# Patient Record
Sex: Female | Born: 1980 | Race: White | Hispanic: No | Marital: Married | Smoking: Current every day smoker
Health system: Southern US, Community
[De-identification: ages and names within clinical notes are randomized; demographics above are authoritative.]

## PROBLEM LIST (undated history)

## (undated) ENCOUNTER — Inpatient Hospital Stay (HOSPITAL_COMMUNITY): Payer: Self-pay

## (undated) DIAGNOSIS — K589 Irritable bowel syndrome without diarrhea: Secondary | ICD-10-CM

## (undated) DIAGNOSIS — Z8719 Personal history of other diseases of the digestive system: Secondary | ICD-10-CM

## (undated) DIAGNOSIS — Z5189 Encounter for other specified aftercare: Secondary | ICD-10-CM

## (undated) DIAGNOSIS — I499 Cardiac arrhythmia, unspecified: Secondary | ICD-10-CM

## (undated) DIAGNOSIS — F419 Anxiety disorder, unspecified: Secondary | ICD-10-CM

## (undated) DIAGNOSIS — Z9889 Other specified postprocedural states: Secondary | ICD-10-CM

## (undated) DIAGNOSIS — Z8711 Personal history of peptic ulcer disease: Secondary | ICD-10-CM

## (undated) DIAGNOSIS — R112 Nausea with vomiting, unspecified: Secondary | ICD-10-CM

## (undated) DIAGNOSIS — T753XXA Motion sickness, initial encounter: Secondary | ICD-10-CM

## (undated) DIAGNOSIS — G43909 Migraine, unspecified, not intractable, without status migrainosus: Secondary | ICD-10-CM

## (undated) DIAGNOSIS — R011 Cardiac murmur, unspecified: Secondary | ICD-10-CM

## (undated) HISTORY — DX: Irritable bowel syndrome, unspecified: K58.9

## (undated) HISTORY — PX: WISDOM TOOTH EXTRACTION: SHX21

## (undated) HISTORY — DX: Encounter for other specified aftercare: Z51.89

## (undated) HISTORY — DX: Anxiety disorder, unspecified: F41.9

## (undated) HISTORY — DX: Migraine, unspecified, not intractable, without status migrainosus: G43.909

## (undated) HISTORY — PX: LAPAROSCOPY: SHX197

## (undated) HISTORY — DX: Cardiac murmur, unspecified: R01.1

## (undated) HISTORY — PX: APPENDECTOMY: SHX54

## (undated) HISTORY — PX: LAPAROTOMY: SHX154

## (undated) HISTORY — DX: Personal history of other diseases of the digestive system: Z87.19

## (undated) HISTORY — DX: Personal history of peptic ulcer disease: Z87.11

## (undated) HISTORY — DX: Cardiac arrhythmia, unspecified: I49.9

---
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
## 2002-07-06 ENCOUNTER — Ambulatory Visit (HOSPITAL_COMMUNITY): Admission: RE | Admit: 2002-07-06 | Discharge: 2002-07-06 | Payer: Self-pay | Admitting: Internal Medicine

## 2002-07-14 ENCOUNTER — Encounter: Payer: Self-pay | Admitting: Internal Medicine

## 2002-07-14 ENCOUNTER — Encounter: Admission: RE | Admit: 2002-07-14 | Discharge: 2002-07-14 | Payer: Self-pay | Admitting: Internal Medicine

## 2002-08-19 ENCOUNTER — Other Ambulatory Visit: Admission: RE | Admit: 2002-08-19 | Discharge: 2002-08-19 | Payer: Self-pay | Admitting: Gynecology

## 2003-04-14 ENCOUNTER — Encounter (INDEPENDENT_AMBULATORY_CARE_PROVIDER_SITE_OTHER): Payer: Self-pay | Admitting: Plastic Surgery

## 2003-04-14 ENCOUNTER — Ambulatory Visit (HOSPITAL_BASED_OUTPATIENT_CLINIC_OR_DEPARTMENT_OTHER): Admission: RE | Admit: 2003-04-14 | Discharge: 2003-04-14 | Payer: Self-pay | Admitting: Plastic Surgery

## 2003-04-14 ENCOUNTER — Ambulatory Visit (HOSPITAL_COMMUNITY): Admission: RE | Admit: 2003-04-14 | Discharge: 2003-04-14 | Payer: Self-pay | Admitting: Plastic Surgery

## 2004-01-02 ENCOUNTER — Other Ambulatory Visit: Admission: RE | Admit: 2004-01-02 | Discharge: 2004-01-02 | Payer: Self-pay | Admitting: Gynecology

## 2004-10-15 ENCOUNTER — Other Ambulatory Visit: Admission: RE | Admit: 2004-10-15 | Discharge: 2004-10-15 | Payer: Self-pay | Admitting: Obstetrics and Gynecology

## 2005-07-11 ENCOUNTER — Other Ambulatory Visit: Admission: RE | Admit: 2005-07-11 | Discharge: 2005-07-11 | Payer: Self-pay | Admitting: Obstetrics and Gynecology

## 2005-07-30 ENCOUNTER — Encounter: Admission: RE | Admit: 2005-07-30 | Discharge: 2005-07-30 | Payer: Self-pay | Admitting: Family Medicine

## 2006-07-14 ENCOUNTER — Other Ambulatory Visit: Admission: RE | Admit: 2006-07-14 | Discharge: 2006-07-14 | Payer: Self-pay | Admitting: Obstetrics and Gynecology

## 2006-10-20 ENCOUNTER — Encounter: Admission: RE | Admit: 2006-10-20 | Discharge: 2006-10-20 | Payer: Self-pay | Admitting: Family Medicine

## 2007-10-01 ENCOUNTER — Other Ambulatory Visit: Admission: RE | Admit: 2007-10-01 | Discharge: 2007-10-01 | Payer: Self-pay | Admitting: Obstetrics and Gynecology

## 2008-06-10 ENCOUNTER — Ambulatory Visit: Payer: Self-pay | Admitting: Internal Medicine

## 2009-07-06 ENCOUNTER — Other Ambulatory Visit: Payer: Self-pay | Admitting: Internal Medicine

## 2009-07-08 HISTORY — PX: CHOLECYSTECTOMY: SHX55

## 2009-07-24 ENCOUNTER — Other Ambulatory Visit: Payer: Self-pay | Admitting: Internal Medicine

## 2009-11-09 ENCOUNTER — Ambulatory Visit: Payer: Self-pay | Admitting: Otolaryngology

## 2010-03-16 ENCOUNTER — Ambulatory Visit: Payer: Self-pay | Admitting: Internal Medicine

## 2010-04-27 ENCOUNTER — Ambulatory Visit: Payer: Self-pay | Admitting: Emergency Medicine

## 2010-05-11 ENCOUNTER — Ambulatory Visit: Payer: Self-pay | Admitting: Surgery

## 2010-06-06 ENCOUNTER — Other Ambulatory Visit: Payer: Self-pay | Admitting: Surgery

## 2010-06-11 ENCOUNTER — Ambulatory Visit: Payer: Self-pay | Admitting: Surgery

## 2010-06-12 LAB — PATHOLOGY REPORT

## 2010-07-08 HISTORY — PX: RHINOPLASTY: SUR1284

## 2011-09-05 ENCOUNTER — Encounter: Payer: Self-pay | Admitting: Maternal & Fetal Medicine

## 2011-09-06 ENCOUNTER — Ambulatory Visit: Payer: Self-pay | Admitting: Maternal & Fetal Medicine

## 2011-09-06 DIAGNOSIS — I4949 Other premature depolarization: Secondary | ICD-10-CM

## 2011-09-11 ENCOUNTER — Ambulatory Visit: Payer: Self-pay | Admitting: Maternal & Fetal Medicine

## 2011-09-11 IMAGING — NM NUCLEAR MEDICINE HEPATOHBILIARY INCLUDE GB
3 series · 21 of 21 positions shown · non-contrast
Comparison: none

REASON FOR EXAM: Cholecystitis       US Done April 27, 2010 Negative
COMMENTS:
TECHNIQUE: Following the uneventful intravenous infusion of
radiopharmaceutical, dynamic anterior regional imaging was obtained over the
liver for 60 minutes.

[Series 1000: gallbladder statics · 4.80mm/px · 9 of 9 slices shown]
[im 1/9]
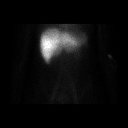
[im 2/9]
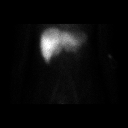
[im 3/9]
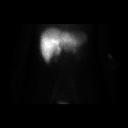
[im 4/9]
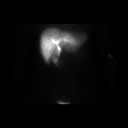
[im 5/9]
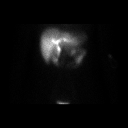
[im 6/9]
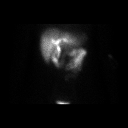
[im 7/9]
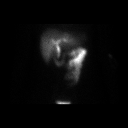
[im 8/9]
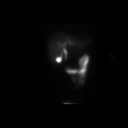
[im 9/9]
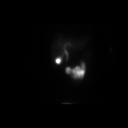

[Series 1000: gallbladder dynamic (results) · 4.80mm/px · 6 of 60 frames shown]
[frame 6/60]
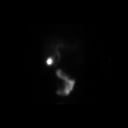
[frame 16/60]
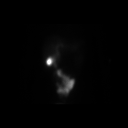
[frame 26/60]
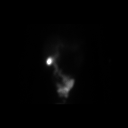
[frame 36/60]
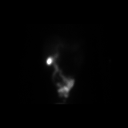
[frame 46/60]
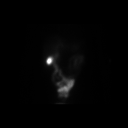
[frame 56/60]
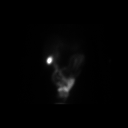

[Series 1000: gallbladder dynamic · 4.80mm/px · 6 of 60 frames shown]
[frame 6/60]
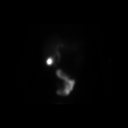
[frame 16/60]
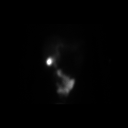
[frame 26/60]
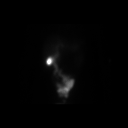
[frame 36/60]
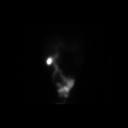
[frame 46/60]
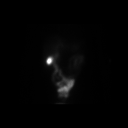
[frame 56/60]
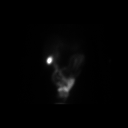

[21 of 21 positions shown; findings below may reference images not displayed]

PROCEDURE:     NM  - NM HEPATO WITH GB EJECT FRACTION  - May 11, 2010  [DATE]

RESULT:     History: Cholecystitis

Radiopharmaceutical: 8.35 mCi [id number] labeled Mebrofenin was administered
intravenously. Once the gallbladder had accumulated tracer, the patient was
given CCK intravenously per standard protocol.
FINDINGS: There is immediate homogeneous uptake of radiotracer in the liver.
Filling of the gallbladder begins at 50 minutes. Radiotracer uptake is
present in the small bowel at 20 minutes.

When gallbladder filling was complete, the patient was given an infusion of
1.25 mcg CCK over 30 minutes. At 30 minutes, the total ejection fraction was
19%. The normal range of ejection fraction is 35%.
IMPRESSION: 1. Normal hepatobiliary scan.
2. Abnormal gallbladder ejection fraction.

## 2012-02-16 ENCOUNTER — Inpatient Hospital Stay: Payer: Self-pay

## 2012-02-16 LAB — CBC WITH DIFFERENTIAL/PLATELET
Basophil #: 0 10*3/uL (ref 0.0–0.1)
Basophil %: 0.5 %
Eosinophil #: 0.1 10*3/uL (ref 0.0–0.7)
Eosinophil %: 0.9 %
HCT: 39 % (ref 35.0–47.0)
HGB: 13.2 g/dL (ref 12.0–16.0)
Lymphocyte #: 2.5 10*3/uL (ref 1.0–3.6)
Lymphocyte %: 26.2 %
MCH: 31.4 pg (ref 26.0–34.0)
MCHC: 33.9 g/dL (ref 32.0–36.0)
MCV: 93 fL (ref 80–100)
Monocyte #: 0.7 x10 3/mm (ref 0.2–0.9)
Monocyte %: 6.9 %
Neutrophil #: 6.3 10*3/uL (ref 1.4–6.5)
Neutrophil %: 65.5 %
Platelet: 230 10*3/uL (ref 150–440)
RBC: 4.21 10*6/uL (ref 3.80–5.20)
RDW: 12.6 % (ref 11.5–14.5)
WBC: 9.6 10*3/uL (ref 3.6–11.0)

## 2012-02-17 LAB — HEMATOCRIT: HCT: 34 % — ABNORMAL LOW (ref 35.0–47.0)

## 2012-09-09 ENCOUNTER — Ambulatory Visit (INDEPENDENT_AMBULATORY_CARE_PROVIDER_SITE_OTHER): Payer: 59 | Admitting: Adult Health

## 2012-09-09 ENCOUNTER — Encounter: Payer: Self-pay | Admitting: Adult Health

## 2012-09-09 VITALS — BP 115/74 | HR 99 | Temp 99.0°F | Resp 14 | Ht 65.0 in | Wt 135.0 lb

## 2012-09-09 DIAGNOSIS — Z0000 Encounter for general adult medical examination without abnormal findings: Secondary | ICD-10-CM

## 2012-09-09 DIAGNOSIS — G43909 Migraine, unspecified, not intractable, without status migrainosus: Secondary | ICD-10-CM | POA: Insufficient documentation

## 2012-09-09 MED ORDER — BUTALBITAL-APAP-CAFFEINE 50-325-40 MG PO TABS: 1.0000 | ORAL_TABLET | Freq: Two times a day (BID) | ORAL | Status: DC | PRN

## 2012-09-09 NOTE — Patient Instructions (Addendum)
  Thank you for choosing [facility] for your health care needs.  Please have your fasting labs drawn at your earliest convenience. Once I receive the results, I will let you know.  Remember to activate your MyChart for easy access to your medical records, labs, etc.
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
## 2012-09-10 ENCOUNTER — Encounter: Payer: Self-pay | Admitting: Adult Health

## 2012-09-10 NOTE — Assessment & Plan Note (Signed)
She has tried numerous migraine medications without real relief. She has also seen a neurologist and is s/p MRI which was negative per patient report. The only medication that has helped her in the past and doesn't make her feel like she is tired is Fioricet. Will order this for her.

## 2012-09-10 NOTE — Assessment & Plan Note (Signed)
Normal physical exam except for tenderness upon palpating abdomen RLQ, RUQ. Patient reports that she has had this evaluated by her OB-GYN since this has occurred since she delivered her child 7 months ago. Check routine labs: cbc w/diff, lipids. I am also ordering a liver panel. She may need a CT scan of the abdomen given persistence of this tenderness.

## 2012-09-24 ENCOUNTER — Other Ambulatory Visit: Payer: Self-pay | Admitting: Internal Medicine

## 2012-09-24 ENCOUNTER — Telehealth: Payer: Self-pay | Admitting: Adult Health

## 2012-09-24 LAB — CBC WITH DIFFERENTIAL/PLATELET
Basophil #: 0.1 10*3/uL (ref 0.0–0.1)
Basophil %: 0.7 %
Eosinophil %: 0.7 %
HCT: 44.1 % (ref 35.0–47.0)
HGB: 15 g/dL (ref 12.0–16.0)
Lymphocyte #: 2.2 10*3/uL (ref 1.0–3.6)
Monocyte #: 0.5 x10 3/mm (ref 0.2–0.9)
Monocyte %: 6.2 %
Neutrophil #: 5.8 10*3/uL (ref 1.4–6.5)
Neutrophil %: 67.2 %
Platelet: 270 10*3/uL (ref 150–440)
RBC: 4.9 10*6/uL (ref 3.80–5.20)
WBC: 8.6 10*3/uL (ref 3.6–11.0)

## 2012-09-24 LAB — LIPID PANEL: Triglycerides: 109 mg/dL (ref 0–200)

## 2012-09-24 LAB — COMPREHENSIVE METABOLIC PANEL
Albumin: 3.9 g/dL (ref 3.4–5.0)
Alkaline Phosphatase: 80 U/L (ref 50–136)
Anion Gap: 6 — ABNORMAL LOW (ref 7–16)
BUN: 8 mg/dL (ref 7–18)
Bilirubin,Total: 0.4 mg/dL (ref 0.2–1.0)
Calcium, Total: 8.8 mg/dL (ref 8.5–10.1)
Creatinine: 0.76 mg/dL (ref 0.60–1.30)
EGFR (African American): >60
EGFR (Non-African Amer.): >60
Glucose: 84 mg/dL (ref 65–99)
Osmolality: 273 (ref 275–301)
SGOT(AST): 12 U/L — ABNORMAL LOW (ref 15–37)
SGPT (ALT): 18 U/L (ref 12–78)
Sodium: 138 mmol/L (ref 136–145)
Total Protein: 7.7 g/dL (ref 6.4–8.2)

## 2012-09-24 LAB — BILIRUBIN, DIRECT: Bilirubin, Direct: <0.1 mg/dL (ref 0.00–0.20)

## 2012-09-24 NOTE — Telephone Encounter (Signed)
[facility] labs in box °

## 2012-10-02 ENCOUNTER — Telehealth: Payer: Self-pay | Admitting: Adult Health

## 2012-10-02 NOTE — Telephone Encounter (Signed)
Lab work done last week at hospital, wants to make sure we received.

## 2012-10-05 NOTE — Telephone Encounter (Signed)
Patient's labs may have been sent to Dr. Dan Humphreys. Can someone check and give the patient a call back with her results.

## 2012-10-06 NOTE — Telephone Encounter (Signed)
Requested labs from [facility]

## 2012-10-07 ENCOUNTER — Telehealth: Payer: Self-pay | Admitting: *Deleted

## 2012-10-07 NOTE — Telephone Encounter (Signed)
Informed patient of normal lab results via cell phone

## 2012-10-07 NOTE — Telephone Encounter (Signed)
Labs received and patient informed.  

## 2012-10-12 ENCOUNTER — Encounter: Payer: Self-pay | Admitting: Internal Medicine

## 2012-10-15 ENCOUNTER — Encounter: Payer: Self-pay | Admitting: Internal Medicine

## 2012-11-24 ENCOUNTER — Telehealth: Payer: Self-pay | Admitting: *Deleted

## 2012-11-24 NOTE — Telephone Encounter (Signed)
Left message for pt to return my call.

## 2012-11-24 NOTE — Telephone Encounter (Signed)
Spoke with pt, thinks right lower quadrant pain has worsened slightly since office visit in March, "more aware" of it now. Pain comes and goes, Ibuprofen, Tylenol does not relieve pain. Denies any other symptoms, no fever, constipation, no bowel changes, no urinary symptoms. Pt unable to relate it to eating, or not eating, moving, lying down. Not related to any specific activity. Does she need to be seen again? Last note mentions possibly needing CT.

## 2012-11-24 NOTE — Telephone Encounter (Signed)
Patient called and stated she is still having he abd pain, she was told to call back if still having symptoms.

## 2012-11-25 NOTE — Telephone Encounter (Signed)
Pt notified of need for an appointment, left message advising to call back and schedule an appointment with Raquel for further evaluation.

## 2012-11-25 NOTE — Telephone Encounter (Signed)
Needs appointment

## 2012-12-03 ENCOUNTER — Encounter: Payer: Self-pay | Admitting: Adult Health

## 2012-12-03 ENCOUNTER — Ambulatory Visit (INDEPENDENT_AMBULATORY_CARE_PROVIDER_SITE_OTHER): Payer: 59 | Admitting: Adult Health

## 2012-12-03 VITALS — BP 116/64 | HR 74 | Resp 12 | Wt 132.5 lb

## 2012-12-03 DIAGNOSIS — G43909 Migraine, unspecified, not intractable, without status migrainosus: Secondary | ICD-10-CM

## 2012-12-03 DIAGNOSIS — R109 Unspecified abdominal pain: Secondary | ICD-10-CM

## 2012-12-03 MED ORDER — TOPIRAMATE 25 MG PO TABS: ORAL_TABLET | ORAL | Status: DC

## 2012-12-03 NOTE — Assessment & Plan Note (Signed)
Start topamax 25 mg daily x 1 week then increase to 50 mg daily. RTC in 4 weeks for f/u.

## 2012-12-03 NOTE — Progress Notes (Signed)
  Subjective:    Patient ID: Ashley Hancock, female    DOB: 01-31-1981, 32 y.o.   MRN: [id number]  HPI  Patient presents with continued lower abdominal pain. Achy, crampy. Sometimes there is a sharp pain. Reports dull ache most of the time. RLQ. She does not notice a pattern to the pain. It is not necessarily worse after eating or exercising. The pain just comes and goes. Pain does not wake her up. Occasional nausea. She notices the pain "sometime" radiates across from left to right and sometimes around to her back. She is s/p cholecystectomy (06/2010). Patient does not have menstrual cycle since being on Depo shots. She has a hx of IBS and after her cholecystectomy her BMs are more frequent. She has not noticed a change in her bowels patterns.  Also having frequent Migraines. Lasting approximately 2-3 days. Fioricet helps but she is interested in preventative measures.   Review of Systems  Constitutional: Negative for fever and chills.  Gastrointestinal: Positive for nausea and abdominal pain. Negative for vomiting, diarrhea, constipation and blood in stool.  Genitourinary: Positive for dyspareunia. Negative for dysuria, frequency, hematuria, vaginal discharge, genital sores and menstrual problem.  Musculoskeletal: Positive for back pain.   BP 116/64  Pulse 74  Resp 12  Wt 132 lb 8 oz (60.102 kg)  BMI 22.05 kg/m2  SpO2 97%  LMP 04/07/2012    Objective:   Physical Exam  Constitutional: She is oriented to person, place, and time. She appears well-developed and well-nourished. No distress.  HENT:  Head: Normocephalic and atraumatic.  Cardiovascular: Normal rate and regular rhythm.   Pulmonary/Chest: Effort normal. No respiratory distress.  Abdominal: Soft. She exhibits no distension and no mass. There is tenderness.  Hyperactive bowel sounds  Neurological: She is alert and oriented to person, place, and time.  Skin: Skin is warm and dry.  Psychiatric: She has a normal mood and affect.  Her behavior is normal. Judgment and thought content normal.       Assessment & Plan:

## 2012-12-03 NOTE — Assessment & Plan Note (Signed)
Pelvic ultrasound and total abdominal ultrasound. Recommend prilosec or other OTC PPI for her epigastric tenderness.

## 2012-12-03 NOTE — Patient Instructions (Addendum)
  I am ordering an Ultrasound of Abdomen and pelvis for abdominal pain.  I am also starting you on topamax for your migraine HA prevention.  Take 25 mg at bedtime for 1 week then increase to 50 mg daily.  Follow up in 4 weeks.

## 2012-12-07 ENCOUNTER — Telehealth: Payer: Self-pay | Admitting: Adult Health

## 2012-12-07 NOTE — Telephone Encounter (Signed)
Left message, advising pt of results.

## 2012-12-07 NOTE — Telephone Encounter (Signed)
Patient called wanting her results from her Abdominal Ultrasound.

## 2012-12-07 NOTE — Telephone Encounter (Signed)
Normal

## 2012-12-07 NOTE — Telephone Encounter (Signed)
I thought I remember seeing this Friday, do you have the report?

## 2012-12-31 ENCOUNTER — Ambulatory Visit: Payer: 59 | Admitting: Adult Health

## 2013-01-06 ENCOUNTER — Encounter: Payer: Self-pay | Admitting: Adult Health

## 2013-01-06 ENCOUNTER — Ambulatory Visit (INDEPENDENT_AMBULATORY_CARE_PROVIDER_SITE_OTHER): Payer: 59 | Admitting: Adult Health

## 2013-01-06 VITALS — BP 106/66 | HR 88 | Temp 98.4°F | Resp 12 | Wt 130.0 lb

## 2013-01-06 DIAGNOSIS — G43909 Migraine, unspecified, not intractable, without status migrainosus: Secondary | ICD-10-CM

## 2013-01-06 MED ORDER — SUMATRIPTAN SUCCINATE 100 MG PO TABS: 100.0000 mg | ORAL_TABLET | Freq: Once | ORAL | Status: DC

## 2013-01-06 MED ORDER — LEVONORGEST-ETH ESTRAD 91-DAY 0.15-0.03 &0.01 MG PO TABS: 1.0000 | ORAL_TABLET | Freq: Every day | ORAL | Status: DC

## 2013-01-06 NOTE — Assessment & Plan Note (Addendum)
Patient was started on Topamax 25 mg daily x1 week and then increase to 50 mg. She reports feeling a bit "off" on the 50 mg dose. Recommend trying to 25 mg for longer period (2-3 weeks) and gradually increasing to see if better tolerated. Patient's migraines appear to also be hormone related as her migraines increase right before her period. She is currently on Yaz 28 day cycle BC. Discussed switching to [facility] (continues 12 weeks) with an effort of trying to keep her hormones at a steadier state. She is agreeable to try this. Prescription given. Also sent in refills for imitrex.

## 2013-01-06 NOTE — Progress Notes (Signed)
  Subjective:    Patient ID: Ashley Hancock, female    DOB: 1981-02-26, 32 y.o.   MRN: [id number]  HPI  Patient is a pleasant 32 year old female who presents to clinic for followup of migraines. She was started on Topamax during her previous visit. She initially started with 25 mg and a week later went up to 50 mg. She has noticed that on the 50 mg she feels a little bit "off". Describes is not quite as lightheaded but just the "feeling you get when you take a muscle relaxer". She has had 3 migraine headaches within the last week week. She reports that this was the week right before her menstrual cycle. She is currently taking Yaz birth control pills 28 day cycle. Patient is currently on her menstrual cycle.   Current Outpatient Prescriptions on File Prior to Visit  Medication Sig Dispense Refill  . butalbital-acetaminophen-caffeine (FIORICET, ESGIC) 50-325-40 MG per tablet Take 1 tablet by mouth 2 (two) times daily as needed for headache.  30 tablet  3  . cyclobenzaprine (FLEXERIL) 5 MG tablet Take 5 mg by mouth as needed for muscle spasms.       No current facility-administered medications on file prior to visit.     Review of Systems  Constitutional: Negative.   Eyes: Negative.   Respiratory: Negative.   Cardiovascular: Negative.   Gastrointestinal: Negative.   Genitourinary: Negative.   Musculoskeletal: Negative.   Neurological: Positive for headaches. Negative for dizziness, weakness and light-headedness.  Psychiatric/Behavioral: Negative.     BP 106/66  Pulse 88  Temp(Src) 98.4 F (36.9 C) (Oral)  Resp 12  Wt 130 lb (58.968 kg)  BMI 21.63 kg/m2  SpO2 96%  LMP 12/30/2012    Objective:   Physical Exam  Constitutional: She is oriented to person, place, and time. She appears well-developed and well-nourished. No distress.  Cardiovascular: Normal rate, regular rhythm and normal heart sounds.  Exam reveals no gallop and no friction rub.   No murmur heard. Pulmonary/Chest:  Effort normal and breath sounds normal. No respiratory distress. She has no wheezes. She has no rales.  Abdominal: Soft. Bowel sounds are normal.  Musculoskeletal: Normal range of motion.  Neurological: She is alert and oriented to person, place, and time. No cranial nerve deficit. Coordination normal.  Skin: Skin is warm and dry.  Psychiatric: She has a normal mood and affect. Her behavior is normal. Judgment and thought content normal.      Assessment & Plan:

## 2013-01-21 ENCOUNTER — Encounter: Payer: Self-pay | Admitting: Adult Health

## 2013-04-08 ENCOUNTER — Other Ambulatory Visit: Payer: Self-pay | Admitting: *Deleted

## 2013-04-08 NOTE — Telephone Encounter (Signed)
Ok to refill 

## 2013-04-11 MED ORDER — BUTALBITAL-APAP-CAFFEINE 50-325-40 MG PO TABS: 1.0000 | ORAL_TABLET | Freq: Two times a day (BID) | ORAL | Status: DC | PRN

## 2013-04-11 NOTE — Telephone Encounter (Signed)
Yes.  OK to refill. 

## 2013-04-12 NOTE — Telephone Encounter (Signed)
Rx faxed to pharmacy  

## 2013-04-28 ENCOUNTER — Other Ambulatory Visit: Payer: Self-pay

## 2013-04-28 LAB — TSH: Thyroid Stimulating Horm: 0.696 u[IU]/mL

## 2013-04-28 LAB — LIPID PANEL
Triglycerides: 137 mg/dL (ref 0–200)
VLDL Cholesterol, Calc: 27 mg/dL (ref 5–40)

## 2013-05-04 ENCOUNTER — Telehealth: Payer: Self-pay | Admitting: Adult Health

## 2013-05-04 NOTE — Telephone Encounter (Signed)
Left message for pt to return my call.

## 2013-05-04 NOTE — Telephone Encounter (Signed)
Pt states she has used Maxalt in the past. States Imitrex was sent in at last visit but prefers Maxalt 10 mg due to side effects from Imitrex. Ok?

## 2013-05-04 NOTE — Telephone Encounter (Signed)
The patient is needing Maxalt called into [facility] pharmacy. Please call the patient when the prescription has been called into the pharmacy.

## 2013-05-04 NOTE — Telephone Encounter (Signed)
Maxalt is ok

## 2013-05-05 MED ORDER — RIZATRIPTAN BENZOATE 10 MG PO TABS: 10.0000 mg | ORAL_TABLET | ORAL | Status: DC | PRN

## 2013-05-05 NOTE — Telephone Encounter (Signed)
Rx sent to pharmacy by escript  

## 2013-05-21 ENCOUNTER — Emergency Department: Payer: Self-pay | Admitting: Emergency Medicine

## 2013-05-21 LAB — URINALYSIS, COMPLETE
Bilirubin,UR: NEGATIVE
Glucose,UR: NEGATIVE mg/dL (ref 0–75)
Ketone: NEGATIVE
Leukocyte Esterase: NEGATIVE
Ph: 7 (ref 4.5–8.0)
Specific Gravity: 1.005 (ref 1.003–1.030)
Squamous Epithelial: 3
WBC UR: <1 /HPF (ref 0–5)

## 2013-05-21 LAB — COMPREHENSIVE METABOLIC PANEL
Anion Gap: 4 — ABNORMAL LOW (ref 7–16)
BUN: 8 mg/dL (ref 7–18)
Bilirubin,Total: 0.2 mg/dL (ref 0.2–1.0)
Calcium, Total: 9.4 mg/dL (ref 8.5–10.1)
Co2: 25 mmol/L (ref 21–32)
Creatinine: 0.74 mg/dL (ref 0.60–1.30)
EGFR (African American): >60
EGFR (Non-African Amer.): >60
Potassium: 3.8 mmol/L (ref 3.5–5.1)
SGOT(AST): 20 U/L (ref 15–37)
SGPT (ALT): 16 U/L (ref 12–78)
Sodium: 138 mmol/L (ref 136–145)
Total Protein: 7.7 g/dL (ref 6.4–8.2)

## 2013-05-21 LAB — LIPASE, BLOOD: Lipase: 77 U/L (ref 73–393)

## 2013-05-21 LAB — CBC
HCT: 44.8 % (ref 35.0–47.0)
MCH: 31.2 pg (ref 26.0–34.0)
WBC: 7.1 10*3/uL (ref 3.6–11.0)

## 2013-05-21 LAB — PREGNANCY, URINE: Pregnancy Test, Urine: NEGATIVE m[IU]/mL

## 2013-05-22 ENCOUNTER — Emergency Department: Payer: Self-pay | Admitting: Emergency Medicine

## 2013-05-22 LAB — URINALYSIS, COMPLETE
Bilirubin,UR: NEGATIVE
Leukocyte Esterase: NEGATIVE
Ph: 7 (ref 4.5–8.0)
Protein: NEGATIVE
Specific Gravity: 1.015 (ref 1.003–1.030)
Squamous Epithelial: 3
WBC UR: 2 /HPF (ref 0–5)

## 2013-05-22 LAB — COMPREHENSIVE METABOLIC PANEL
Alkaline Phosphatase: 81 U/L (ref 50–136)
Anion Gap: 4 — ABNORMAL LOW (ref 7–16)
BUN: 9 mg/dL (ref 7–18)
Bilirubin,Total: 0.3 mg/dL (ref 0.2–1.0)
Co2: 27 mmol/L (ref 21–32)
Creatinine: 0.74 mg/dL (ref 0.60–1.30)
EGFR (African American): >60
Glucose: 102 mg/dL — ABNORMAL HIGH (ref 65–99)
Osmolality: 271 (ref 275–301)
Potassium: 3.6 mmol/L (ref 3.5–5.1)
SGOT(AST): 40 U/L — ABNORMAL HIGH (ref 15–37)
SGPT (ALT): 84 U/L — ABNORMAL HIGH (ref 12–78)
Total Protein: 7.8 g/dL (ref 6.4–8.2)

## 2013-05-22 LAB — CBC
HCT: 44.7 % (ref 35.0–47.0)
MCHC: 34.2 g/dL (ref 32.0–36.0)
MCV: 91 fL (ref 80–100)
Platelet: 292 10*3/uL (ref 150–440)
RDW: 12.2 % (ref 11.5–14.5)

## 2013-05-22 LAB — LIPASE, BLOOD: Lipase: 80 U/L (ref 73–393)

## 2013-05-27 ENCOUNTER — Ambulatory Visit: Payer: Self-pay | Admitting: Gastroenterology

## 2013-10-07 ENCOUNTER — Other Ambulatory Visit: Payer: Self-pay | Admitting: Adult Health

## 2014-01-03 ENCOUNTER — Ambulatory Visit: Payer: Self-pay | Admitting: Surgery

## 2014-02-03 ENCOUNTER — Inpatient Hospital Stay: Payer: Self-pay | Admitting: Surgery

## 2014-02-03 LAB — CBC WITH DIFFERENTIAL/PLATELET
BASOS PCT: 0.1 %
BASOS PCT: 0.6 %
Basophil #: 0 10*3/uL (ref 0.0–0.1)
Basophil #: 0.1 10*3/uL (ref 0.0–0.1)
Eosinophil #: 0 10*3/uL (ref 0.0–0.7)
Eosinophil #: 0 10*3/uL (ref 0.0–0.7)
Eosinophil %: 0 %
Eosinophil %: 0 %
HCT: 30 % — AB (ref 35.0–47.0)
HCT: 25.9 % — ABNORMAL LOW (ref 35.0–47.0)
HGB: 9.7 g/dL — ABNORMAL LOW (ref 12.0–16.0)
HGB: 8.5 g/dL — AB (ref 12.0–16.0)
LYMPHS ABS: 0.6 10*3/uL — AB (ref 1.0–3.6)
LYMPHS PCT: 5.2 %
Lymphocyte #: 0.4 10*3/uL — ABNORMAL LOW (ref 1.0–3.6)
Lymphocyte %: 3.6 %
MCH: 30.6 pg (ref 26.0–34.0)
MCH: 30.9 pg (ref 26.0–34.0)
MCHC: 32.2 g/dL (ref 32.0–36.0)
MCHC: 32.8 g/dL (ref 32.0–36.0)
MCV: 95 fL (ref 80–100)
MCV: 94 fL (ref 80–100)
MONO ABS: 0.8 x10 3/mm (ref 0.2–0.9)
MONO ABS: 0.7 x10 3/mm (ref 0.2–0.9)
MONOS PCT: 6.8 %
MONOS PCT: 6.1 %
NEUTROS ABS: 10.6 10*3/uL — AB (ref 1.4–6.5)
NEUTROS PCT: 88.1 %
Neutrophil #: 9.7 10*3/uL — ABNORMAL HIGH (ref 1.4–6.5)
Neutrophil %: 89.5 %
PLATELETS: 234 10*3/uL (ref 150–440)
PLATELETS: 214 10*3/uL (ref 150–440)
RBC: 3.16 10*6/uL — AB (ref 3.80–5.20)
RBC: 2.74 10*6/uL — AB (ref 3.80–5.20)
RDW: 12.6 % (ref 11.5–14.5)
RDW: 12.4 % (ref 11.5–14.5)
WBC: 11.9 10*3/uL — AB (ref 3.6–11.0)
WBC: 11 10*3/uL (ref 3.6–11.0)

## 2014-02-03 LAB — PRO B NATRIURETIC PEPTIDE: B-Type Natriuretic Peptide: 40 pg/mL (ref 0–125)

## 2014-02-03 LAB — PLATELET COUNT: PLATELETS: 300 10*3/uL (ref 150–440)

## 2014-02-04 LAB — BASIC METABOLIC PANEL
Anion Gap: 5 — ABNORMAL LOW (ref 7–16)
BUN: 4 mg/dL — ABNORMAL LOW (ref 7–18)
Calcium, Total: 7.1 mg/dL — ABNORMAL LOW (ref 8.5–10.1)
Chloride: 113 mmol/L — ABNORMAL HIGH (ref 98–107)
Co2: 25 mmol/L (ref 21–32)
Creatinine: 0.65 mg/dL (ref 0.60–1.30)
EGFR (African American): >60
EGFR (Non-African Amer.): >60
Glucose: 130 mg/dL — ABNORMAL HIGH (ref 65–99)
Osmolality: 284 (ref 275–301)
Potassium: 3.9 mmol/L (ref 3.5–5.1)
Sodium: 143 mmol/L (ref 136–145)

## 2014-02-04 LAB — CBC WITH DIFFERENTIAL/PLATELET
Basophil #: 0 10*3/uL (ref 0.0–0.1)
Basophil %: 0.2 %
EOS PCT: 0 %
Eosinophil #: 0 10*3/uL (ref 0.0–0.7)
HCT: 24.5 % — AB (ref 35.0–47.0)
HGB: 8.2 g/dL — ABNORMAL LOW (ref 12.0–16.0)
Lymphocyte #: 1.4 10*3/uL (ref 1.0–3.6)
Lymphocyte %: 15 %
MCH: 31.6 pg (ref 26.0–34.0)
MCHC: 33.6 g/dL (ref 32.0–36.0)
MCV: 94 fL (ref 80–100)
MONO ABS: 1 x10 3/mm — AB (ref 0.2–0.9)
Monocyte %: 11.3 %
NEUTROS ABS: 6.7 10*3/uL — AB (ref 1.4–6.5)
NEUTROS PCT: 73.5 %
PLATELETS: 236 10*3/uL (ref 150–440)
RBC: 2.6 10*6/uL — AB (ref 3.80–5.20)
RDW: 12.5 % (ref 11.5–14.5)
WBC: 9.1 10*3/uL (ref 3.6–11.0)

## 2014-02-05 LAB — CBC WITH DIFFERENTIAL/PLATELET
Basophil #: 0 10*3/uL (ref 0.0–0.1)
Basophil #: 0 10*3/uL (ref 0.0–0.1)
Basophil %: 0.3 %
Basophil %: 0.4 %
EOS ABS: 0.3 10*3/uL (ref 0.0–0.7)
EOS PCT: 1.5 %
Eosinophil #: 0.1 10*3/uL (ref 0.0–0.7)
Eosinophil %: 3.6 %
HCT: 21.7 % — AB (ref 35.0–47.0)
HCT: 25.8 % — AB (ref 35.0–47.0)
HGB: 7.2 g/dL — AB (ref 12.0–16.0)
HGB: 8.6 g/dL — ABNORMAL LOW (ref 12.0–16.0)
LYMPHS ABS: 1.8 10*3/uL (ref 1.0–3.6)
LYMPHS ABS: 2.4 10*3/uL (ref 1.0–3.6)
LYMPHS PCT: 31 %
Lymphocyte %: 24.6 %
MCH: 31.8 pg (ref 26.0–34.0)
MCH: 31.2 pg (ref 26.0–34.0)
MCHC: 33.4 g/dL (ref 32.0–36.0)
MCHC: 33.2 g/dL (ref 32.0–36.0)
MCV: 95 fL (ref 80–100)
MCV: 94 fL (ref 80–100)
MONOS PCT: 11.1 %
Monocyte #: 0.8 x10 3/mm (ref 0.2–0.9)
Monocyte #: 0.9 x10 3/mm (ref 0.2–0.9)
Monocyte %: 11.6 %
NEUTROS ABS: 4.5 10*3/uL (ref 1.4–6.5)
NEUTROS PCT: 53.4 %
Neutrophil #: 4.1 10*3/uL (ref 1.4–6.5)
Neutrophil %: 62.5 %
Platelet: 192 10*3/uL (ref 150–440)
Platelet: 190 10*3/uL (ref 150–440)
RBC: 2.28 10*6/uL — ABNORMAL LOW (ref 3.80–5.20)
RBC: 2.75 10*6/uL — ABNORMAL LOW (ref 3.80–5.20)
RDW: 13 % (ref 11.5–14.5)
RDW: 13.1 % (ref 11.5–14.5)
WBC: 7.2 10*3/uL (ref 3.6–11.0)
WBC: 7.6 10*3/uL (ref 3.6–11.0)

## 2014-02-05 LAB — PATHOLOGY REPORT

## 2014-02-06 LAB — CBC WITH DIFFERENTIAL/PLATELET
BASOS ABS: 0 10*3/uL (ref 0.0–0.1)
Basophil %: 0.4 %
Eosinophil #: 0.3 10*3/uL (ref 0.0–0.7)
Eosinophil %: 4.9 %
HCT: 23.8 % — ABNORMAL LOW (ref 35.0–47.0)
HGB: 8 g/dL — ABNORMAL LOW (ref 12.0–16.0)
Lymphocyte #: 2.3 10*3/uL (ref 1.0–3.6)
Lymphocyte %: 38.3 %
MCH: 31.7 pg (ref 26.0–34.0)
MCHC: 33.6 g/dL (ref 32.0–36.0)
MCV: 94 fL (ref 80–100)
Monocyte #: 0.6 x10 3/mm (ref 0.2–0.9)
Monocyte %: 9.8 %
Neutrophil #: 2.8 10*3/uL (ref 1.4–6.5)
Neutrophil %: 46.6 %
Platelet: 180 10*3/uL (ref 150–440)
RBC: 2.53 10*6/uL — ABNORMAL LOW (ref 3.80–5.20)
RDW: 12.8 % (ref 11.5–14.5)
WBC: 6 10*3/uL (ref 3.6–11.0)

## 2014-03-01 ENCOUNTER — Other Ambulatory Visit: Payer: Self-pay | Admitting: Surgery

## 2014-03-01 LAB — CBC WITH DIFFERENTIAL/PLATELET
BASOS ABS: 0.1 10*3/uL (ref 0.0–0.1)
BASOS PCT: 0.9 %
EOS PCT: 2.3 %
Eosinophil #: 0.2 10*3/uL (ref 0.0–0.7)
HCT: 38.2 % (ref 35.0–47.0)
HGB: 12.2 g/dL (ref 12.0–16.0)
Lymphocyte #: 2.1 10*3/uL (ref 1.0–3.6)
Lymphocyte %: 29.8 %
MCH: 30.4 pg (ref 26.0–34.0)
MCHC: 32 g/dL (ref 32.0–36.0)
MCV: 95 fL (ref 80–100)
MONOS PCT: 7.8 %
Monocyte #: 0.6 x10 3/mm (ref 0.2–0.9)
NEUTROS PCT: 59.2 %
Neutrophil #: 4.2 10*3/uL (ref 1.4–6.5)
PLATELETS: 306 10*3/uL (ref 150–440)
RBC: 4.02 10*6/uL (ref 3.80–5.20)
RDW: 12.7 % (ref 11.5–14.5)
WBC: 7.1 10*3/uL (ref 3.6–11.0)

## 2014-03-01 LAB — BASIC METABOLIC PANEL
Anion Gap: 8 (ref 7–16)
BUN: 5 mg/dL — ABNORMAL LOW (ref 7–18)
CALCIUM: 9 mg/dL (ref 8.5–10.1)
CREATININE: 0.6 mg/dL (ref 0.60–1.30)
Chloride: 108 mmol/L — ABNORMAL HIGH (ref 98–107)
Co2: 23 mmol/L (ref 21–32)
EGFR (Non-African Amer.): >60
GLUCOSE: 96 mg/dL (ref 65–99)
OSMOLALITY: 275 (ref 275–301)
POTASSIUM: 4 mmol/L (ref 3.5–5.1)
Sodium: 139 mmol/L (ref 136–145)

## 2014-09-27 LAB — OB RESULTS CONSOLE ABO/RH: RH TYPE: POSITIVE

## 2014-09-27 LAB — OB RESULTS CONSOLE GC/CHLAMYDIA
Chlamydia: NEGATIVE
GC PROBE AMP, GENITAL: NEGATIVE

## 2014-09-27 LAB — OB RESULTS CONSOLE RUBELLA ANTIBODY, IGM: Rubella: IMMUNE

## 2014-09-27 LAB — OB RESULTS CONSOLE ANTIBODY SCREEN: ANTIBODY SCREEN: NEGATIVE

## 2014-09-27 LAB — OB RESULTS CONSOLE HIV ANTIBODY (ROUTINE TESTING): HIV: NONREACTIVE

## 2014-09-27 LAB — OB RESULTS CONSOLE RPR: RPR: NONREACTIVE

## 2014-09-27 LAB — OB RESULTS CONSOLE HEPATITIS B SURFACE ANTIGEN: HEP B S AG: NEGATIVE

## 2014-10-11 ENCOUNTER — Encounter: Payer: Self-pay | Admitting: *Deleted
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
## 2014-10-29 NOTE — Op Note (Signed)
PATIENT NAME:  Ashley Hancock, Ashley Hancock MR#:  [id number] DATE OF BIRTH:  October 07, 1980  DATE OF PROCEDURE:  02/03/2014  PREOPERATIVE DIAGNOSIS: Chronic/abdominal pain.   POSTOPERATIVE DIAGNOSES: 1.  Chronic pelvic pain.  2.  Endometriosis.   PROCEDURES: 1.  Diagnostic laparoscopy.  2.  Fulguration of endometrial implants.  3.  Repair of colonic injury by general surgery (Dr. Marina Gravel).  4.  Appendectomy by general surgery (Dr. Marina Gravel).   ANESTHESIA: General.   SURGEON: Prentice Docker, MD, Sherri Rad M.D.   ESTIMATED BLOOD LOSS: 25 mL.   COMPLICATIONS: Small perforation to  a large bowel using cold dissection.   FINDINGS: 1.  Multiple endometriosis implants throughout pelvis including the left and right ovarian fossae and posterior cul-de-sac.  2.  Normal-appearing bilateral fallopian tubes and ovaries.  3.  Left paratubal cyst.   SPECIMENS:  1.  Peritoneal biopsy in posterior cul-de-sac.  2.  Appendix.  CONDITION AT THE END OF PROCEDURE: Stable.   PROCEDURE IN DETAIL: The patient was taken to the operating room where general anesthesia was administered and found to be adequate. The patient was placed in the dorsal supine lithotomy position in the [place] stirrups and prepped and draped in the usual sterile fashion. Care was taken to position the patient with respect to prevent any nerve damage. After a timeout was called, the patient's bladder was drained using in and out catheterization. Sterile speculum was placed in the vagina and a single-tooth tenaculum was affixed to the anterior lip of the cervix. An acorn uterine manipulator was then affixed to the tenaculum. The speculum was then removed. Attention was turned to the abdomen and opening incision performed by Dr. Marina Gravel via direct entry technique and Hasson trocar was placed.   A right upper quadrant 5 mm port was placed by Dr. Marina Gravel, as well as a left lower quadrant 5 mm port both via direct intraabdominal camera visualization without  difficulty. A survey of the abdomen and pelvis was undertaken with the above-noted findings. Attempted biopsy of the posterior cul-de-sac was undertaken in the usual fashion. Cold scissors were utilized to take a superficial biopsy of the peritoneum. The peritoneum was dissected sharply using no cautery and dissected in a circumferential fashion. After the specimen was removed in its entirety, inspection was undertaken when an approximately 1 cm defect in the anterior colon was noted. At this point, a decision was made to convert to an open laparotomy via Pfannenstiel incision, which was performed without difficulty. See Dr. Algernon Huxley operative report for details of repair of the colonic injury and subsequent evaluation of the rest of the bowel.   An appendectomy was performed as well by Dr. Marina Gravel. For complete details of that portion of the procedure, please see Dr. Algernon Huxley report. After hemostasis was assured, the rectus muscle bellies were reapproximated by Dr. Marina Gravel and after assurance of hemostasis of the rectus muscle, the fascia was closed using 2 sutures, each starting at the lateral apices and meeting in the midline where they were tied together. Irrigation was undertaken and hemostasis of the subcutaneous tissue was assured. All skin incisions were closed by Dr. Marina Gravel. The acorn uterine manipulator was removed at this point and a single-tooth tenaculum was removed after placement of the speculum. Hemostasis was noted at the tenaculum site. Speculum was removed and assurance of no remaining instrumentation or sponges was undertaken in the vagina.   This concluded the procedure. The patient tolerated the procedure well. Sponge, lap, and needle counts were correct x2. The patient received  1 gram Ancef prior to skin incision. She was awakened in the operating room and taken to the recovery area in stable condition.    ____________________________ Will Bonnet, MD sdj:ds D: 02/03/2014 20:52:00  ET T: 02/03/2014 21:39:50 ET JOB#: [id number]  cc: Will Bonnet, MD, <Dictator> Will Bonnet MD ELECTRONICALLY SIGNED 02/04/2014 1:39

## 2014-10-29 NOTE — Op Note (Signed)
PATIENT NAME:  Ashley Hancock, Ashley Hancock MR#:  [id number] DATE OF BIRTH:  1981-01-23  DATE OF PROCEDURE:  02/03/2014  PREOPERATIVE DIAGNOSIS: Chronic abdominal pain.   POSTOPERATIVE DIAGNOSIS: Chronic abdominal pain, possible endometriosis, final pathology pending.   PROCEDURES PERFORMED: Diagnostic laparoscopy with conversion to laparotomy via Pfannenstiel incision, open appendectomy and repair of anterior distal sigmoid colon injury.   SURGEON: Sherri Rad, M.D.   ASSISTANT: Dr. Donneta Romberg (OB/GYN)   SPECIMENS: As described above.   DESCRIPTION OF PROCEDURE: With informed consent, supine position, Dr. Glennon Mac placed a cervical tenaculum for manipulation. The patient's abdomen was widely prepped and draped with ChloraPrep solution. Timeout was observed. A 12 mm blunt Hassan trocar was placed through an open technique through an infraumbilical transversely oriented skin incision. A 5 mm bladeless trocar was placed in the right upper quadrant and an additional one in the left lower quadrant. Dr. Glennon Mac then proceeded in biopsying endometrial-laden peritoneum which appeared to be scarred within the cul-de-sac. The specimen was retrieved. No cautery was used. This was all done with sharp dissection. The inspection of the area demonstrated some bleeding and what appeared to be a 1 cm opening in the anterior portion of the rectum. I then elected to proceed with an open procedure.   Ports were then removed under direct visualization. A Pfannenstiel incision was fashioned in the standard fashion with creation of superior and inferior rectus flaps using elctrocautery.   A self-retaining abdominal wall retractor was placed. The pelvis was then irrigated. Injury appeared to be as stated above. It had clean edges. No evidence of further extension or swiss-cheesing present. The mucosa was then reapproximated with a running 3-0 chromic suture. Interrupted 3-0 silk in Lembert fashion was applied. Further epiploic fat  was then used to cover the defect. Then 5 mL of fibrin glue was then placed followed by placement of the omentum over the repair.   Attention was then turned to the appendectomy. The mesoappendix was divided with the LigaSure apparatus with advanced hemostasis feature. The confluence of the tinea was identified. An endoscopic 35 mm stapler with blue load application was used to transect the appendix at its base.   Staple line was imbricated with 3-0 silk sutures.  I submitted the specimen as permanent section.   With the hemostasis being ensured on the operative field, the small bowel was run and found to have no evidence of Meckel diverticulitis or Crohn disease.   The rectus muscle was re-approximated with interrupted #1 vicryls.  The anterior fascia was then reapproximated utilizing running #1 Vicryls times two.  The infraumbilical fascial defect was reapproximated with the existing stay sutures being tied to each other.  Skin edges were reapproximated utilizing a skin stapler. The patient was then subsequently extubated and taken to the recovery room in stable condition following placement of sterile dressings and the tenaculum being removed by Dr. Glennon Mac.   ____________________________ Jeannette How Marina Gravel, MD mab:sb D: 02/03/2014 09:42:45 ET T: 02/03/2014 10:05:29 ET JOB#: [id number]  cc: Elta Guadeloupe A. Marina Gravel, MD, <Dictator> Hortencia Conradi MD ELECTRONICALLY SIGNED 02/08/2014 15:02

## 2014-10-29 NOTE — Discharge Summary (Signed)
PATIENT NAME:  Ashley Hancock, Ashley Hancock MR#:  [id number] DATE OF BIRTH:  [birth year]  DATE OF ADMISSION:  02/03/2014 DATE OF DISCHARGE:  02/07/2014  FINAL DIAGNOSES: 1.  Endometriosis with chronic right lower quadrant abdominal pain 2.  Acute post hemorrhagic and postoperative anemia.  PRINCIPAL PROCEDURES: 1.  Laparoscopy with conversion to laparotomy.  2.  Repair of colon injury, incidental appendectomy, biopsy of cul-de-sac peritoneal implants. 3.  1-unit blood transfusion.  3.  Re-exploration and evacuation of abdominal wall rectus sheath hematoma.  HOSPITAL COURSE SUMMARY: The patient was brought in as an outpatient for a diagnostic gynecological laparoscopy and appendectomy for chronic right lower quadrant and pelvic pain. At the time of surgery, the patient had a full-thickness injury to the colon. This required conversion to open operation, which was repaired in 2-layered fashion. Appendectomy was performed. Postoperatively, the patient did well for a couple of hours then started having hypotension, nausea, and increasingly lethargy and abdominal pain.  She would respond to IVF boluses but blood pressure remained low immediately following infusion.   A hemoglobin was found to be 9.7 around this same time period.  When I saw her the afternoon of surgery, around 5 pm, she had significant delirium as well as continued hypotension, a very tender abdomen, and the decision was made to take her back to the operating room, at which point a rectus sheath hematoma was encountered and evacuated. Postoperatively, the patient did well. She did have symptomatic anemia requiring a blood transfusion. She was seen by GYN as well. On postoperative day #3, the patient continued to improve. On postoperative day #4, she was tolerating a diet and was therefore discharged home in stable condition that afternoon. She will follow up in our office in 1 week's time for staple removal.  MEDICATIONS: Flexeril, Maxalt, prenatal  vitamins, magnesium oxide, Percocet, and iron polysaccharide.   DISCHARGE INSTRUCTIONS: Call with any questions or concerns.    ____________________________ Jeannette How Marina Gravel, MD FACS mab:sk D: 02/14/2014 19:29:00 ET T: 02/15/2014 00:54:05 ET JOB#: [id number]  cc: Elta Guadeloupe A. Marina Gravel, MD, <Dictator> Kathia Covington A Tyrome Donatelli MD ELECTRONICALLY SIGNED 02/15/2014 1:28
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
## 2014-11-15 NOTE — H&P (Signed)
L&D Evaluation:  History Expanded:   HPI 34 yo G1 with EDD of 02/21/12 per LMP & 13 week [place], presents at 39 weeks in active labor, dilated 5 cm, no LOF or VB. PNC at [facility] notable for early entry to care, h/o PVCs, migraines and heart murmur. Pt had DP consult who recommended growth USs. EFW at 32 weeks 50%.    Blood Type (Maternal) A positive    Group B Strep Results Maternal (Result >5wks must be treated as unknown) negative    Maternal HIV Negative    Maternal Syphilis Ab Nonreactive    Maternal Varicella Immune    Rubella Results (Maternal) immune    Maternal T-Dap Immune    Patient's Medical History PVCs, Migraines, heart murmur    Patient's Surgical History Colecystectomy  rhinoplasty    Medications Pre Natal Vitamins  Fioricet    Allergies NKDA    Social History none   ROS:   ROS see HPI   Exam:   Vital Signs stable    General no apparent distress    Mental Status clear    Abdomen gravid, non-tender, NT after epidural    Edema no edema    Pelvic no external lesions, 9/100/0    Mebranes AROM with exam    Description bloody, scant fluid    FHT normal rate with no decels   Impression:   Impression active labor   Plan:   Comments Anticipate vaginal delivery   Electronic Signatures: Ander Purpura (CNM)  (Signed 11-Aug-13 06:57)  Authored: L&D Evaluation   Last Updated: 11-Aug-13 06:57 by Ander Purpura (CNM)

## 2014-12-18 ENCOUNTER — Encounter (HOSPITAL_COMMUNITY): Payer: Self-pay | Admitting: *Deleted

## 2014-12-18 ENCOUNTER — Inpatient Hospital Stay (HOSPITAL_COMMUNITY)
Admission: AD | Admit: 2014-12-18 | Discharge: 2014-12-18 | Disposition: A | Discharge status: Home / Self Care | Payer: 59 | Source: Ambulatory Visit | Attending: Obstetrics and Gynecology | Admitting: Obstetrics and Gynecology

## 2014-12-18 DIAGNOSIS — Z87891 Personal history of nicotine dependence: Secondary | ICD-10-CM | POA: Insufficient documentation

## 2014-12-18 DIAGNOSIS — O9989 Other specified diseases and conditions complicating pregnancy, childbirth and the puerperium: Secondary | ICD-10-CM | POA: Insufficient documentation

## 2014-12-18 DIAGNOSIS — Z3A19 19 weeks gestation of pregnancy: Secondary | ICD-10-CM | POA: Diagnosis not present

## 2014-12-18 DIAGNOSIS — O26892 Other specified pregnancy related conditions, second trimester: Secondary | ICD-10-CM

## 2014-12-18 DIAGNOSIS — N898 Other specified noninflammatory disorders of vagina: Secondary | ICD-10-CM | POA: Diagnosis not present

## 2014-12-18 LAB — WET PREP, GENITAL
Clue Cells Wet Prep HPF POC: NONE SEEN
TRICH WET PREP: NONE SEEN
Yeast Wet Prep HPF POC: NONE SEEN

## 2014-12-18 NOTE — MAU Provider Note (Signed)
History     CSN: [id number]  Arrival date and time: 12/18/14 2123   First Provider Initiated Contact with Patient 12/18/14 2228      Chief Complaint  Patient presents with  . Vaginal Discharge   HPI  [name] is a 34 y.o. G2P1002 at [redacted]w[redacted]d here with report of of blood tinged mucus yesterday.  +cramping.  Fetal movement felt less yesterday.  Feeling movement in MAU.    Past Medical History  Diagnosis Date  . Migraines   . Heart murmur   . IBS (irritable bowel syndrome)     Past Surgical History  Procedure Laterality Date  . Cholecystectomy  2011  . Rhinoplasty  2012  . Laparoscopy    . Laparotomy      x2    Family History  Problem Relation Age of Onset  . Hypertension Mother   . Heart disease Father     CHF;MI  . Diabetes Father     History  Substance Use Topics  . Smoking status: Former Smoker -- 1.00 packs/day for 12 years    Types: Cigarettes  . Smokeless tobacco: Not on file  . Alcohol Use: No    Allergies: No Known Allergies  Prescriptions prior to admission  Medication Sig Dispense Refill Last Dose  . acetaminophen (TYLENOL) 500 MG tablet Take 1,000 mg by mouth every 6 (six) hours as needed for mild pain.    12/17/2014 at Unknown time  . butalbital-acetaminophen-caffeine (FIORICET, ESGIC) 50-325-40 MG per tablet Take 1 tablet by mouth 2 (two) times daily as needed for headache. 30 tablet 3 12/17/2014 at Unknown time  . Clobetasol Propionate (TEMOVATE) 0.05 % external spray Apply 1 application topically 2 (two) times a week.   Past Week at Unknown time  . cyclobenzaprine (FLEXERIL) 5 MG tablet Take 5 mg by mouth as needed for muscle spasms.   Past Month at Unknown time  . hydrocortisone cream 1 % Apply 1 application topically 2 (two) times a week.   Past Week at Unknown time  . oxyCODONE-acetaminophen (PERCOCET/ROXICET) 5-325 MG per tablet Take 1 tablet by mouth every 4 (four) hours as needed (severe headache).   two weeks at two weeks  .  Prenatal Vit-Fe Fumarate-FA (MULTIVITAMIN-PRENATAL) 27-0.8 MG TABS tablet Take 1 tablet by mouth daily at 12 noon.   12/18/2014 at Unknown time  . rizatriptan (MAXALT) 10 MG tablet Take 1 tablet by mouth as needed for migraine. May repeat in 2 hours if needed 10 tablet 0 prn at prn  . Levonorgestrel-Ethinyl Estradiol (AMETHIA,CAMRESE) 0.15-0.03 &0.01 MG tablet Take 1 tablet by mouth daily. 1 Package 4   . topiramate (TOPAMAX) 25 MG tablet Take 25 mg by mouth 2 (two) times daily. Take 1 tablet at bedtime for 1 week then increase to 2 tablets.   Taking    Review of Systems  Gastrointestinal: Positive for abdominal pain (cramping). Negative for diarrhea and constipation.  Genitourinary: Negative for dysuria and urgency.       Vaginal discharge  All other systems reviewed and are negative.  Physical Exam   Blood pressure 124/68, pulse 96, temperature 98.2 F (36.8 C), temperature source Oral, resp. rate 16, last menstrual period 08/02/2014.  Physical Exam  Constitutional: She is oriented to person, place, and time. She appears well-developed and well-nourished.  HENT:  Head: Normocephalic.  Neck: Normal range of motion. Neck supple.  Cardiovascular: Normal rate, regular rhythm and normal heart sounds.   Respiratory: Effort normal and breath sounds normal.  GI: Soft. There is no tenderness.  Genitourinary: No bleeding in the vagina. Vaginal discharge (mucusy) found.  Musculoskeletal: Normal range of motion. She exhibits no edema.  Neurological: She is alert and oriented to person, place, and time.  Skin: Skin is warm and dry.   FHR 139  Dilation: Closed Effacement (%): Thick Cervical Position: Posterior Exam by:: Reina Fuse, CNM  MAU Course  Procedures Results for orders placed or performed during the hospital encounter of 12/18/14 (from the past 24 hour(s))  Wet prep, genital     Status: Abnormal   Collection Time: 12/18/14 10:54 PM  Result Value Ref Range   Yeast Wet Prep HPF POC  NONE SEEN NONE SEEN   Trich, Wet Prep NONE SEEN NONE SEEN   Clue Cells Wet Prep HPF POC NONE SEEN NONE SEEN   WBC, Wet Prep HPF POC MODERATE (A) NONE SEEN   2310 Consulted with Dr. Radene Knee > Reviewed HPI/exam>discharge to home with follow-up as scheduled  Assessment and Plan  35 y.o. G2P1002 at [redacted]w[redacted]d IUP  Vaginal Discharge in Pregnancy - normal exam  Plan: Discharge to home Reviewed pregnancy precautions Keep scheduled appt in office  Joyelle Siedlecki Michiel Cowboy, CNM

## 2014-12-18 NOTE — MAU Note (Signed)
Pt reports yesterday am she passed what looked like her mucous plug. Tonight has been having cramping and decreased movement.

## 2015-05-01 ENCOUNTER — Telehealth (HOSPITAL_COMMUNITY): Payer: Self-pay | Admitting: *Deleted

## 2015-05-01 ENCOUNTER — Encounter (HOSPITAL_COMMUNITY): Payer: Self-pay | Admitting: *Deleted

## 2015-05-01 LAB — OB RESULTS CONSOLE GBS: GBS: NEGATIVE

## 2015-05-01 NOTE — Telephone Encounter (Signed)
Preadmission screen  

## 2015-05-02 ENCOUNTER — Inpatient Hospital Stay (HOSPITAL_COMMUNITY)
Admission: RE | Admit: 2015-05-02 | Discharge: 2015-05-04 | DRG: 775 | Disposition: A | Discharge status: Home / Self Care | Payer: 59 | Source: Ambulatory Visit | Attending: Obstetrics & Gynecology | Admitting: Obstetrics & Gynecology

## 2015-05-02 ENCOUNTER — Encounter (HOSPITAL_COMMUNITY): Payer: Self-pay

## 2015-05-02 ENCOUNTER — Inpatient Hospital Stay (HOSPITAL_COMMUNITY): Payer: 59 | Admitting: Anesthesiology

## 2015-05-02 DIAGNOSIS — O26893 Other specified pregnancy related conditions, third trimester: Secondary | ICD-10-CM | POA: Diagnosis present

## 2015-05-02 DIAGNOSIS — Z8249 Family history of ischemic heart disease and other diseases of the circulatory system: Secondary | ICD-10-CM

## 2015-05-02 DIAGNOSIS — Z3A39 39 weeks gestation of pregnancy: Secondary | ICD-10-CM

## 2015-05-02 DIAGNOSIS — Z3490 Encounter for supervision of normal pregnancy, unspecified, unspecified trimester: Secondary | ICD-10-CM

## 2015-05-02 DIAGNOSIS — Z833 Family history of diabetes mellitus: Secondary | ICD-10-CM | POA: Diagnosis not present

## 2015-05-02 DIAGNOSIS — Z8711 Personal history of peptic ulcer disease: Secondary | ICD-10-CM | POA: Diagnosis not present

## 2015-05-02 DIAGNOSIS — Z87891 Personal history of nicotine dependence: Secondary | ICD-10-CM | POA: Diagnosis not present

## 2015-05-02 LAB — CBC
HEMATOCRIT: 34.7 % — AB (ref 36.0–46.0)
Hemoglobin: 11.7 g/dL — ABNORMAL LOW (ref 12.0–15.0)
MCH: 30.2 pg (ref 26.0–34.0)
MCHC: 33.7 g/dL (ref 30.0–36.0)
MCV: 89.4 fL (ref 78.0–100.0)
PLATELETS: 200 10*3/uL (ref 150–400)
RBC: 3.88 MIL/uL (ref 3.87–5.11)
RDW: 12.9 % (ref 11.5–15.5)
WBC: 7.9 10*3/uL (ref 4.0–10.5)

## 2015-05-02 LAB — RPR: RPR Ser Ql: NONREACTIVE

## 2015-05-02 MED ORDER — OXYCODONE-ACETAMINOPHEN 5-325 MG PO TABS: 2.0000 | ORAL_TABLET | ORAL | Status: DC | PRN

## 2015-05-02 MED ORDER — EPHEDRINE 5 MG/ML INJ
10.0000 mg | INTRAVENOUS | Status: DC | PRN
  Filled 2015-05-02: qty 2

## 2015-05-02 MED ORDER — CITRIC ACID-SODIUM CITRATE 334-500 MG/5ML PO SOLN: 30.0000 mL | ORAL | Status: DC | PRN

## 2015-05-02 MED ORDER — ONDANSETRON HCL 4 MG/2ML IJ SOLN: 4.0000 mg | Freq: Four times a day (QID) | INTRAMUSCULAR | Status: DC | PRN

## 2015-05-02 MED ORDER — ONDANSETRON HCL 4 MG PO TABS: 4.0000 mg | ORAL_TABLET | ORAL | Status: DC | PRN

## 2015-05-02 MED ORDER — LACTATED RINGERS IV SOLN
INTRAVENOUS | Status: DC
  Administered 2015-05-02 (×2): via INTRAVENOUS

## 2015-05-02 MED ORDER — DIPHENHYDRAMINE HCL 25 MG PO CAPS: 25.0000 mg | ORAL_CAPSULE | Freq: Four times a day (QID) | ORAL | Status: DC | PRN

## 2015-05-02 MED ORDER — ONDANSETRON HCL 4 MG/2ML IJ SOLN: 4.0000 mg | INTRAMUSCULAR | Status: DC | PRN

## 2015-05-02 MED ORDER — DIPHENHYDRAMINE HCL 50 MG/ML IJ SOLN
12.5000 mg | INTRAMUSCULAR | Status: DC | PRN
Start: 2015-05-02 — End: 2015-05-02

## 2015-05-02 MED ORDER — OXYTOCIN 40 UNITS IN LACTATED RINGERS INFUSION - SIMPLE MED: 1.0000 m[IU]/min | INTRAVENOUS | Status: DC

## 2015-05-02 MED ORDER — LACTATED RINGERS IV SOLN: 500.0000 mL | INTRAVENOUS | Status: DC | PRN

## 2015-05-02 MED ORDER — SIMETHICONE 80 MG PO CHEW: 80.0000 mg | CHEWABLE_TABLET | ORAL | Status: DC | PRN

## 2015-05-02 MED ORDER — FENTANYL 2.5 MCG/ML BUPIVACAINE 1/10 % EPIDURAL INFUSION (WH - ANES)
14.0000 mL/h | INTRAMUSCULAR | Status: DC | PRN
  Administered 2015-05-02 (×2): 14 mL/h via EPIDURAL
  Filled 2015-05-02: qty 125

## 2015-05-02 MED ORDER — LIDOCAINE HCL (PF) 1 % IJ SOLN
30.0000 mL | INTRAMUSCULAR | Status: DC | PRN
  Filled 2015-05-02: qty 30

## 2015-05-02 MED ORDER — OXYCODONE-ACETAMINOPHEN 5-325 MG PO TABS
1.0000 | ORAL_TABLET | ORAL | Status: DC | PRN
  Administered 2015-05-02 – 2015-05-04 (×5): 1 via ORAL
  Filled 2015-05-02 (×5): qty 1

## 2015-05-02 MED ORDER — FLEET ENEMA 7-19 GM/118ML RE ENEM: 1.0000 | ENEMA | RECTAL | Status: DC | PRN

## 2015-05-02 MED ORDER — OXYCODONE-ACETAMINOPHEN 5-325 MG PO TABS: 1.0000 | ORAL_TABLET | ORAL | Status: DC | PRN

## 2015-05-02 MED ORDER — BUTORPHANOL TARTRATE 1 MG/ML IJ SOLN
1.0000 mg | INTRAMUSCULAR | Status: DC | PRN
  Administered 2015-05-02: 1 mg via INTRAVENOUS
  Filled 2015-05-02: qty 1

## 2015-05-02 MED ORDER — OXYTOCIN 40 UNITS IN LACTATED RINGERS INFUSION - SIMPLE MED
1.0000 m[IU]/min | INTRAVENOUS | Status: DC
  Administered 2015-05-02: 2 m[IU]/min via INTRAVENOUS
  Filled 2015-05-02: qty 1000

## 2015-05-02 MED ORDER — TERBUTALINE SULFATE 1 MG/ML IJ SOLN
0.2500 mg | Freq: Once | INTRAMUSCULAR | Status: DC | PRN
  Filled 2015-05-02: qty 1

## 2015-05-02 MED ORDER — OXYTOCIN BOLUS FROM INFUSION
500.0000 mL | INTRAVENOUS | Status: DC
  Administered 2015-05-02: 500 mL via INTRAVENOUS

## 2015-05-02 MED ORDER — PHENYLEPHRINE 40 MCG/ML (10ML) SYRINGE FOR IV PUSH (FOR BLOOD PRESSURE SUPPORT)
80.0000 ug | PREFILLED_SYRINGE | INTRAVENOUS | Status: DC | PRN
  Filled 2015-05-02: qty 20
  Filled 2015-05-02: qty 2

## 2015-05-02 MED ORDER — SENNOSIDES-DOCUSATE SODIUM 8.6-50 MG PO TABS
2.0000 | ORAL_TABLET | ORAL | Status: DC
  Administered 2015-05-03: 2 via ORAL
  Filled 2015-05-02 (×2): qty 2

## 2015-05-02 MED ORDER — ZOLPIDEM TARTRATE 5 MG PO TABS: 5.0000 mg | ORAL_TABLET | Freq: Every evening | ORAL | Status: DC | PRN

## 2015-05-02 MED ORDER — TETANUS-DIPHTH-ACELL PERTUSSIS 5-2.5-18.5 LF-MCG/0.5 IM SUSP: 0.5000 mL | Freq: Once | INTRAMUSCULAR | Status: DC

## 2015-05-02 MED ORDER — ACETAMINOPHEN 325 MG PO TABS: 650.0000 mg | ORAL_TABLET | ORAL | Status: DC | PRN

## 2015-05-02 MED ORDER — LIDOCAINE HCL (PF) 1 % IJ SOLN
INTRAMUSCULAR | Status: DC | PRN
  Administered 2015-05-02 (×2): 4 mL via EPIDURAL

## 2015-05-02 MED ORDER — PRENATAL MULTIVITAMIN CH
1.0000 | ORAL_TABLET | Freq: Every day | ORAL | Status: DC
  Administered 2015-05-03: 1 via ORAL
  Filled 2015-05-02: qty 1

## 2015-05-02 MED ORDER — IBUPROFEN 600 MG PO TABS
600.0000 mg | ORAL_TABLET | Freq: Four times a day (QID) | ORAL | Status: DC
Start: 2015-05-02 — End: 2015-05-04
  Administered 2015-05-03 (×5): 600 mg via ORAL
  Filled 2015-05-02 (×6): qty 1

## 2015-05-02 MED ORDER — BENZOCAINE-MENTHOL 20-0.5 % EX AERO
1.0000 "application " | INHALATION_SPRAY | CUTANEOUS | Status: DC | PRN
  Administered 2015-05-03: 1 via TOPICAL
  Filled 2015-05-02: qty 56

## 2015-05-02 MED ORDER — DIBUCAINE 1 % RE OINT: 1.0000 "application " | TOPICAL_OINTMENT | RECTAL | Status: DC | PRN

## 2015-05-02 MED ORDER — WITCH HAZEL-GLYCERIN EX PADS: 1.0000 "application " | MEDICATED_PAD | CUTANEOUS | Status: DC | PRN

## 2015-05-02 MED ORDER — OXYTOCIN 40 UNITS IN LACTATED RINGERS INFUSION - SIMPLE MED: 62.5000 mL/h | INTRAVENOUS | Status: DC

## 2015-05-02 MED ORDER — LANOLIN HYDROUS EX OINT: TOPICAL_OINTMENT | CUTANEOUS | Status: DC | PRN

## 2015-05-02 NOTE — H&P (Signed)
Ashley Hancock is a 34 y.o. female presenting for elective IOL. Pregnancy uncomplicated. Maternal Medical History:  Fetal activity: Perceived fetal activity is normal.      OB History    Gravida Para Term Preterm AB TAB SAB Ectopic Multiple Living   2 1 1       1      Past Medical History  Diagnosis Date  . Migraines   . Heart murmur   . IBS (irritable bowel syndrome)   . Anxiety   . Dysrhythmia     irreg HR and PVCs  . History of gastric ulcer   . Blood transfusion without reported diagnosis     1unit post laparotomy 2015   Past Surgical History  Procedure Laterality Date  . Cholecystectomy  2011  . Rhinoplasty  2012  . Laparoscopy    . Laparotomy      x2  . Appendectomy    . Wisdom tooth extraction     Family History: family history includes Bipolar disorder in her mother; Diabetes in her father; Heart disease in her father; Hypertension in her mother; Thyroid disease in her mother. Social History:  reports that she has quit smoking. Her smoking use included Cigarettes. She has a 12 pack-year smoking history. She does not have any smokeless tobacco history on file. She reports that she does not drink alcohol or use illicit drugs.   Prenatal Transfer Tool  Maternal Diabetes: No Genetic Screening: Normal Maternal Ultrasounds/Referrals: Normal Fetal Ultrasounds or other Referrals:  None Maternal Substance Abuse:  No Significant Maternal Medications:  None Significant Maternal Lab Results:  None Other Comments:  None  Review of Systems  Eyes: Negative for blurred vision.  Gastrointestinal: Negative for abdominal pain.  Neurological: Negative for headaches.    Dilation: 2 Effacement (%): 50 Station: -3 Exam by:: Dr Gaetano Net Height 5\' 6"  (1.676 m), weight 167 lb (75.751 kg), last menstrual period 08/02/2014. Maternal Exam:  Uterine Assessment: Contraction strength is mild.  Contraction frequency is irregular.   Abdomen: Patient reports no abdominal  tenderness. Fetal presentation: vertex     Fetal Exam Fetal State Assessment: Category I - tracings are normal.     Physical Exam  Cardiovascular: Normal rate and regular rhythm.   Respiratory: Effort normal and breath sounds normal.  GI: Soft. There is no tenderness.  Neurological: She has normal reflexes.    Prenatal labs: ABO, Rh: A/Positive/-- (03/22 0000) Antibody: Negative (03/22 0000) Rubella: Immune (03/22 0000) RPR: Nonreactive (03/22 0000)  HBsAg: Negative (03/22 0000)  HIV: Non-reactive (03/22 0000)  GBS: Negative (10/24 0000)   Assessment/Plan: 34 yo G2P1 @ 39 0/7 weeks for IOL Will begin pitocin Epidural prn D/W patient   Massiah Longanecker II,Kinsman Antolin E 05/02/2015, 9:20 AM

## 2015-05-02 NOTE — Anesthesia Preprocedure Evaluation (Addendum)
Anesthesia Evaluation  Patient identified by MRN, date of birth, ID band Patient awake    Reviewed: Allergy & Precautions, NPO status , Patient's Chart, lab work & pertinent test results  Airway Mallampati: II  TM Distance: >3 FB Neck ROM: Full    Dental  (+) Teeth Intact   Pulmonary former smoker,    breath sounds clear to auscultation       Cardiovascular + dysrhythmias  Rhythm:Regular Rate:Normal     Neuro/Psych  Headaches, PSYCHIATRIC DISORDERS Anxiety    GI/Hepatic negative GI ROS, Neg liver ROS,   Endo/Other  negative endocrine ROS  Renal/GU negative Renal ROS  negative genitourinary   Musculoskeletal negative musculoskeletal ROS (+)   Abdominal   Peds negative pediatric ROS (+)  Hematology negative hematology ROS (+)   Anesthesia Other Findings   Reproductive/Obstetrics (+) Pregnancy                            Lab Results  Component Value Date   WBC 7.9 05/02/2015   HGB 11.7* 05/02/2015   HCT 34.7* 05/02/2015   MCV 89.4 05/02/2015   PLT 200 05/02/2015   No results found for: INR, PROTIME   Anesthesia Physical Anesthesia Plan  ASA: II  Anesthesia Plan: Epidural   Post-op Pain Management:    Induction:   Airway Management Planned:   Additional Equipment:   Intra-op Plan:   Post-operative Plan:   Informed Consent: I have reviewed the patients History and Physical, chart, labs and discussed the procedure including the risks, benefits and alternatives for the proposed anesthesia with the patient or authorized representative who has indicated his/her understanding and acceptance.     Plan Discussed with:   Anesthesia Plan Comments:         Anesthesia Quick Evaluation

## 2015-05-02 NOTE — Anesthesia Procedure Notes (Signed)
Epidural Patient location during procedure: OB Start time: 05/02/2015 1:52 PM End time: 05/02/2015 1:58 PM  Staffing Anesthesiologist: Suella Broad D Performed by: anesthesiologist   Preanesthetic Checklist Completed: patient identified, site marked, surgical consent, pre-op evaluation, timeout performed, IV checked, risks and benefits discussed and monitors and equipment checked  Epidural Patient position: sitting Prep: Betadine Patient monitoring: heart rate, continuous pulse ox and blood pressure Approach: midline Location: L4-L5 Injection technique: LOR saline  Needle:  Needle type: Tuohy  Needle gauge: 18 G Needle length: 9 cm and 9 Catheter type: closed end flexible Catheter size: 20 Guage Test dose: negative and Other  Assessment Events: blood not aspirated, injection not painful, no injection resistance, negative IV test and no paresthesia  Additional Notes LOR @ 4.5  Patient identified. Risks/Benefits/Options discussed with patient including but not limited to bleeding, infection, nerve damage, paralysis, failed block, incomplete pain control, headache, blood pressure changes, nausea, vomiting, reactions to medications, itching and postpartum back pain. Confirmed with bedside nurse the patient's most recent platelet count. Confirmed with patient that they are not currently taking any anticoagulation, have any bleeding history or any family history of bleeding disorders. Patient expressed understanding and wished to proceed. All questions were answered. Sterile technique was used throughout the entire procedure. Please see nursing notes for vital signs. Test dose was given through epidural catheter and negative prior to continuing to dose epidural or start infusion. Warning signs of high block given to the patient including shortness of breath, tingling/numbness in hands, complete motor block, or any concerning symptoms with instructions to call for help. Patient was given  instructions on fall risk and not to get out of bed. All questions and concerns addressed with instructions to call with any issues or inadequate analgesia.      Patient tolerated the insertion well without complications.Reason for block:procedure for pain

## 2015-05-02 NOTE — Progress Notes (Signed)
Delivery Note At 4:21 PM a viable female was delivered via Vaginal, Spontaneous Delivery (Presentation: Right Occiput Anterior).  APGAR: 9, 9; weight  .   Placenta status: Intact, Spontaneous.  Cord: 3 vessels with the following complications: None.  Cord pH: pending  Anesthesia: Epidural  Episiotomy: None Lacerations: 2nd degree Suture Repair: 3.0 vicryl rapide Est. Blood Loss (mL): 100  Mom to postpartum.  Baby to Couplet care / Skin to Skin.  Dontrel Smethers II,Jenan Ellegood E 05/02/2015, 4:35 PM

## 2015-05-02 NOTE — Progress Notes (Signed)
4/80/-1 AROM clear FHT cat one Epidural in

## 2015-05-03 LAB — CBC
HCT: 31.8 % — ABNORMAL LOW (ref 36.0–46.0)
Hemoglobin: 10.4 g/dL — ABNORMAL LOW (ref 12.0–15.0)
MCH: 29.6 pg (ref 26.0–34.0)
MCHC: 32.7 g/dL (ref 30.0–36.0)
MCV: 90.6 fL (ref 78.0–100.0)
PLATELETS: 189 10*3/uL (ref 150–400)
RBC: 3.51 MIL/uL — ABNORMAL LOW (ref 3.87–5.11)
RDW: 12.9 % (ref 11.5–15.5)
WBC: 9.2 10*3/uL (ref 4.0–10.5)

## 2015-05-03 NOTE — Lactation Note (Signed)
This note was copied from the chart of Ashley Aiya Heminger. Lactation Consultation Note  Patient Name: Ashley Hancock HWEXH'B Date: 05/03/2015 Reason for consult: Initial assessment   Initial consultation with 2nd time mom at 54 hours old. Infant with 8 BF for 15-45 minutes, 2 voids, 2 stools and 3% weight loss since birth. Latch scores of 8-9 noted. Mom reports she had difficulty with 32 yo. She was a difficult latch and mom had excoriated nipples. She reports she was very anxious and her milk supply dwindled so she stopped BF at 6 weeks. She really wants to BF [place]. Infant was drowsy and would not latch for this feeding. Infant is also gaggy. Mom to hold STS. Mom with large firm breasts and everted nipples. Mom able to easily hand express large gtts colostrum from left breast, she reports right breast has more. Encouraged mom to feed 8-12 x in 24 hours at first feeding cues. Mom is [employer] Employee and is to obtain DEBP through insurance prior to D/C. Enc mom to call with questions/concerns. LC Brochure, feeding log, and BF REsources handout given, discussed availability of OP services, OP phone #, Support Groups and  BF Resources. Mom voiced understanding to keep infant STS when not wanting to awaken to feed.    Maternal Data Formula Feeding for Exclusion: No Does the patient have breastfeeding experience prior to this delivery?: Yes  Feeding Feeding Type: Breast Fed Length of feed: 0 min  LATCH Score/Interventions Latch: Too sleepy or reluctant, no latch achieved, no sucking elicited. Intervention(s): Skin to skin;Waking techniques  Audible Swallowing: None Intervention(s): Hand expression  Type of Nipple: Everted at rest and after stimulation  Comfort (Breast/Nipple): Soft / non-tender     Hold (Positioning): Assistance needed to correctly position infant at breast and maintain latch. Intervention(s): Breastfeeding basics reviewed;Support Pillows;Position options;Skin to  skin  LATCH Score: 5  Lactation Tools Discussed/Used     Consult Status      Donn Pierini 05/03/2015, 12:23 PM

## 2015-05-03 NOTE — Anesthesia Postprocedure Evaluation (Signed)
Anesthesia Post Note  Patient: Ashley Hancock  Procedure(s) Performed: * No procedures listed *  Anesthesia type: Epidural  Patient location: Mother/Baby  Post pain: Pain level controlled  Post assessment: Post-op Vital signs reviewed  Last Vitals:  Filed Vitals:   05/03/15 0543  BP: 119/82  Pulse: 80  Temp: 36.4 C  Resp: 18    Post vital signs: Reviewed  Level of consciousness: awake  Complications: No apparent anesthesia complications

## 2015-05-03 NOTE — Progress Notes (Signed)
Post Partum Day 1 Subjective: no complaints, up ad lib, voiding and tolerating PO  Objective: Blood pressure 128/87, pulse 96, temperature 97.8 F (36.6 C), temperature source Oral, resp. rate 18, height 5\' 6"  (1.676 m), weight 167 lb (75.751 kg), last menstrual period 08/02/2014, SpO2 100 %, unknown if currently breastfeeding.  Physical Exam:  General: alert and cooperative Lochia: appropriate Uterine Fundus: firm Incision: healing well DVT Evaluation: No evidence of DVT seen on physical exam. Negative Homan's sign. No cords or calf tenderness. No significant calf/ankle edema.   Recent Labs  05/02/15 0755 05/03/15 0635  HGB 11.7* 10.4*  HCT 34.7* 31.8*    Assessment/Plan: Plan for discharge tomorrow   LOS: 1 day   CURTIS,CAROL G 05/03/2015, 8:44 AM

## 2015-05-03 NOTE — Clinical Social Work Maternal (Signed)
CLINICAL SOCIAL WORK MATERNAL/CHILD NOTE  Patient Details  Name: Ashley Hancock MRN: [id number] Date of Birth: 1980/11/11  Date:  05/03/2015  Clinical Social Worker Initiating Note:  Lucita Ferrara MSW, LCSW Date/ Time Initiated:  05/03/15/1230     Child's Name:  Kristin Bruins   Legal Guardian:  Vincente Liberty and Randall Hiss Troia  Need for Interpreter:  None   Date of Referral:  05/01/15     Reason for Referral:  History of anxiety  Referral Source:  [facility]   Address:  [place], [place] [address]  Phone number:  [phone]   Household Members:  Minor Children, Spouse   Natural Supports (not living in the home):  Extended Family, Immediate Family   Professional Supports: None   Employment: Full-time   Type of Work: Therapist, sports at [employer]   Education:  Engineer, maintenance Resources:  Multimedia programmer   Other Resources:    None identified   Cultural/Religious Considerations Which May Impact Care:  None reported  Strengths:  Ability to meet basic needs , Home prepared for child , Pediatrician chosen    Risk Factors/Current Problems:   1)Mental Health Concerns: MOB presents with history of anxiety and postpartum anxiety.  MOB shared belief that she is less anxious as she transitions postpartum, and processed gained insight and wisdom that has assisted her to disengage from anxious thought processes.   Cognitive State:  Able to Concentrate , Alert , Goal Oriented , Linear Thinking , Insightful    Mood/Affect:  Happy , Animated, Calm , Comfortable , Interested    CSW Assessment:  CSW received request for consult due to history of postpartum anxiety.  MOB presented as easily engaged and receptive to the visit. She was noted to be in a calm and pleasant mood, and displayed a full range in affect. MOB was interacting and caring for the infant, and she openly discussed her prior mental health history.  MOB's comments highlight insight and  self-awareness related to her mental health, and she discussed how her experiences with her previous postpartum have assisted her to feel more prepared and less anxious.  MOB endorsed presence of anxiety "on and off" for years prior to her first daughter's birth in 2013.  She stated that she noted an increase in anxiety after her first daughter was born, and shared belief that it was closely linked with being a first time mother. She shared that she was focused on exclusively breastfeeding, but discussed challenges since her daughter had a difficult time latching and she never experienced a strong milk supply since she was "anxious".  MOB reported that these events triggered anxiety since she thought breast milk was "best".  MOB also reported that she had a difficult time sleeping postpartum since she would wake up anytime the infant made a noise and had a difficult time allowing others to care for her since she thought that they would not be able to know how to.  She continued to process her previous negative core beliefs about herself since she thought that she was a bad mother since sometimes she could not figure out how to soothe the infant.   During the periods of postpartum anxiety, MOB shared that she gained insight, adjusted her thought process, and changed her approach to parenthood which she intends to apply as she transitions postpartum. She stated that she realized that it is okay for the infant to receive formula. She shared that she is wanting this infant to breastfeed,  but reported that it will be "okay" if she receives formula since her first daughter is healthy despite formula.  MOB discussed that she is no longer concerned if the infant cries since she knows that it is okay for them to cry, and no longer believes that she is a bad mother if the infant cries.  She also reported that she has learned to accept help in order to sleep.  Overall, MOB reported that she does not feel anxious as she  transitions postpartum, and she stated that this strongly contrasts how she felt when she was in the hospital after her first daughter was born.   MOB reported she has been prescribed a low dose of Zoloft by her OB since she had an increase in anxiety early in the pregnancy; however, MOB stated that she never started the medication. She shared belief that it was situational as she had just learned that she was pregnant and her daughter became sick.  MOB denied ongoing symptoms during the pregnancy.  MOB expressed intention to follow up with her OB or other medical provider if she notes onset of symptoms postpartum.  CSW provided education on the [facility] and perinatal mood disorders.   MOB expressed appreciation for the visit, information, and support. She acknowledged ongoing CSW availability, and agreed to request CSW follow up if needs arise during the admission.   CSW Plan/Description:   1)Patient/Family Education: Perinatal mood disorders 2)Information/Referral to [employer]: Feelings After Birth Support Group 3)No Further Intervention Required/No Barriers to Discharge    Sharyl Nimrod 05/03/2015, 12:54 PM

## 2015-05-04 MED ORDER — IBUPROFEN 600 MG PO TABS: 600.0000 mg | ORAL_TABLET | Freq: Four times a day (QID) | ORAL | Status: DC

## 2015-05-04 MED ORDER — OXYCODONE-ACETAMINOPHEN 5-325 MG PO TABS: 1.0000 | ORAL_TABLET | ORAL | Status: DC | PRN

## 2015-05-04 NOTE — Discharge Summary (Signed)
Obstetric Discharge Summary Reason for Admission: induction of labor Prenatal Procedures: ultrasound Intrapartum Procedures: spontaneous vaginal delivery Postpartum Procedures: none Complications-Operative and Postpartum: 2 degree perineal laceration HEMOGLOBIN  Date Value Ref Range Status  05/03/2015 10.4* 12.0 - 15.0 g/dL Final   HGB  Date Value Ref Range Status  03/01/2014 12.2 12.0-16.0 g/dL Final   HCT  Date Value Ref Range Status  05/03/2015 31.8* 36.0 - 46.0 % Final  03/01/2014 38.2 35.0-47.0 % Final    Physical Exam:  General: alert and cooperative Lochia: appropriate Uterine Fundus: firm Incision: healing well DVT Evaluation: No evidence of DVT seen on physical exam. Negative Homan's sign. No cords or calf tenderness. No significant calf/ankle edema.  Discharge Diagnoses: Term Pregnancy-delivered  Discharge Information: Date: 05/04/2015 Activity: pelvic rest Diet: routine Medications: PNV, Ibuprofen and Percocet Condition: stable Instructions: refer to practice specific booklet Discharge to: home   Newborn Data: Live born female  Birth Weight: 6 lb 15.1 oz (3150 g) APGAR: 9, 9  Home with mother.  Tasheem Elms G 05/04/2015, 8:38 AM

## 2015-05-04 NOTE — Lactation Note (Signed)
This note was copied from the chart of Ashley Hancock. Lactation Consultation Note  Patient Name: Ashley Hancock GOVPC'H Date: 05/04/2015 Reason for consult: Follow-up assessment;Infant weight loss;Breast/nipple pain   Follow up with mom prior to D/C of 8 hour old infant. Infant with Bf X 5, attempts X 3, bottle fed formula x 3 5-7cc, 2 voids and 6 stools in last 24 hours. Mom reports that infant had cluster feeds last night. Mom reports some nipple pain with feeding, she is wearing comfort gels. BF Basics reviewed, she reports she does flange infants lips as needed. Infant is sleepy at the breast requiring stimulation to keep awake for feeding. Mom was able to express large gtts colostrum yesterday, she is feeling fuller today, encouraged her to hand express and pump to use her BM for supplementation. Mom is able to hand express and has Medela PIS to take home. Discussed Engorgement prevention/Treatment with mom and encouraged prepumping to soften areola and post pump for comfort. Reviewed BF information in Taking Care of Baby and Me Booklet. [facility] Brochure reviewed. Enc mom to call with questions/concerns. She is aware of Support Groups, OP services and BF Resources.    Maternal Data Formula Feeding for Exclusion: No Has patient been taught Hand Expression?: Yes Does the patient have breastfeeding experience prior to this delivery?: Yes  Feeding    LATCH Score/Interventions                      Lactation Tools Discussed/Used WIC Program: No Pump Review: Setup, frequency, and cleaning;Milk Storage Date initiated:: 05/04/15   Consult Status Consult Status: Complete Follow-up type: Call as needed    Ashley Hancock 05/04/2015, 10:25 AM

## 2015-08-25 DIAGNOSIS — Z30017 Encounter for initial prescription of implantable subdermal contraceptive: Secondary | ICD-10-CM | POA: Diagnosis not present

## 2015-09-04 DIAGNOSIS — L989 Disorder of the skin and subcutaneous tissue, unspecified: Secondary | ICD-10-CM | POA: Diagnosis not present

## 2015-09-04 DIAGNOSIS — B079 Viral wart, unspecified: Secondary | ICD-10-CM | POA: Diagnosis not present

## 2015-09-04 DIAGNOSIS — L905 Scar conditions and fibrosis of skin: Secondary | ICD-10-CM | POA: Diagnosis not present

## 2015-09-04 DIAGNOSIS — J3489 Other specified disorders of nose and nasal sinuses: Secondary | ICD-10-CM | POA: Diagnosis not present

## 2015-09-04 DIAGNOSIS — D220 Melanocytic nevi of lip: Secondary | ICD-10-CM | POA: Diagnosis not present

## 2015-09-13 DIAGNOSIS — Z1389 Encounter for screening for other disorder: Secondary | ICD-10-CM | POA: Diagnosis not present

## 2015-09-13 DIAGNOSIS — G43909 Migraine, unspecified, not intractable, without status migrainosus: Secondary | ICD-10-CM | POA: Diagnosis not present

## 2015-09-13 DIAGNOSIS — Z720 Tobacco use: Secondary | ICD-10-CM | POA: Diagnosis not present

## 2015-09-13 DIAGNOSIS — Z6820 Body mass index (BMI) 20.0-20.9, adult: Secondary | ICD-10-CM | POA: Diagnosis not present

## 2015-09-13 DIAGNOSIS — F418 Other specified anxiety disorders: Secondary | ICD-10-CM | POA: Diagnosis not present

## 2015-09-13 DIAGNOSIS — N808 Other endometriosis: Secondary | ICD-10-CM | POA: Diagnosis not present

## 2015-10-25 DIAGNOSIS — Z789 Other specified health status: Secondary | ICD-10-CM | POA: Diagnosis not present

## 2015-10-25 DIAGNOSIS — L720 Epidermal cyst: Secondary | ICD-10-CM | POA: Diagnosis not present

## 2015-10-25 DIAGNOSIS — B078 Other viral warts: Secondary | ICD-10-CM | POA: Diagnosis not present

## 2015-11-22 DIAGNOSIS — B078 Other viral warts: Secondary | ICD-10-CM | POA: Diagnosis not present

## 2015-11-22 DIAGNOSIS — L814 Other melanin hyperpigmentation: Secondary | ICD-10-CM | POA: Diagnosis not present

## 2015-11-29 DIAGNOSIS — Z3046 Encounter for surveillance of implantable subdermal contraceptive: Secondary | ICD-10-CM | POA: Diagnosis not present

## 2015-12-18 ENCOUNTER — Ambulatory Visit (INDEPENDENT_AMBULATORY_CARE_PROVIDER_SITE_OTHER): Payer: 59 | Admitting: General Surgery

## 2015-12-18 ENCOUNTER — Encounter: Payer: Self-pay | Admitting: *Deleted

## 2015-12-18 ENCOUNTER — Ambulatory Visit: Payer: 59 | Admitting: Anesthesiology

## 2015-12-18 ENCOUNTER — Other Ambulatory Visit: Payer: Self-pay

## 2015-12-18 ENCOUNTER — Encounter
Admission: RE | Disposition: A | Discharge status: Home / Self Care | Payer: Self-pay | Source: Ambulatory Visit | Attending: Surgery

## 2015-12-18 ENCOUNTER — Ambulatory Visit
Admission: RE | Admit: 2015-12-18 | Discharge: 2015-12-18 | Disposition: A | Discharge status: Home / Self Care | Payer: 59 | Source: Ambulatory Visit | Attending: Surgery | Admitting: Surgery

## 2015-12-18 ENCOUNTER — Encounter: Payer: Self-pay | Admitting: General Surgery

## 2015-12-18 ENCOUNTER — Encounter (INDEPENDENT_AMBULATORY_CARE_PROVIDER_SITE_OTHER): Payer: Self-pay

## 2015-12-18 VITALS — BP 141/80 | HR 76 | Temp 98.4°F | Ht 65.0 in | Wt 124.6 lb

## 2015-12-18 DIAGNOSIS — Z818 Family history of other mental and behavioral disorders: Secondary | ICD-10-CM | POA: Insufficient documentation

## 2015-12-18 DIAGNOSIS — Z8711 Personal history of peptic ulcer disease: Secondary | ICD-10-CM | POA: Diagnosis not present

## 2015-12-18 DIAGNOSIS — K645 Perianal venous thrombosis: Secondary | ICD-10-CM | POA: Insufficient documentation

## 2015-12-18 DIAGNOSIS — Z8249 Family history of ischemic heart disease and other diseases of the circulatory system: Secondary | ICD-10-CM | POA: Diagnosis not present

## 2015-12-18 DIAGNOSIS — Z681 Body mass index (BMI) 19 or less, adult: Secondary | ICD-10-CM | POA: Diagnosis not present

## 2015-12-18 DIAGNOSIS — Z87891 Personal history of nicotine dependence: Secondary | ICD-10-CM | POA: Diagnosis not present

## 2015-12-18 DIAGNOSIS — Z881 Allergy status to other antibiotic agents status: Secondary | ICD-10-CM | POA: Insufficient documentation

## 2015-12-18 DIAGNOSIS — Z8349 Family history of other endocrine, nutritional and metabolic diseases: Secondary | ICD-10-CM | POA: Diagnosis not present

## 2015-12-18 DIAGNOSIS — Z79899 Other long term (current) drug therapy: Secondary | ICD-10-CM | POA: Diagnosis not present

## 2015-12-18 DIAGNOSIS — R011 Cardiac murmur, unspecified: Secondary | ICD-10-CM | POA: Insufficient documentation

## 2015-12-18 DIAGNOSIS — Z30430 Encounter for insertion of intrauterine contraceptive device: Secondary | ICD-10-CM | POA: Diagnosis not present

## 2015-12-18 DIAGNOSIS — K589 Irritable bowel syndrome without diarrhea: Secondary | ICD-10-CM | POA: Diagnosis not present

## 2015-12-18 DIAGNOSIS — Z791 Long term (current) use of non-steroidal anti-inflammatories (NSAID): Secondary | ICD-10-CM | POA: Insufficient documentation

## 2015-12-18 DIAGNOSIS — Z3200 Encounter for pregnancy test, result unknown: Secondary | ICD-10-CM | POA: Diagnosis not present

## 2015-12-18 DIAGNOSIS — Z833 Family history of diabetes mellitus: Secondary | ICD-10-CM | POA: Diagnosis not present

## 2015-12-18 DIAGNOSIS — F419 Anxiety disorder, unspecified: Secondary | ICD-10-CM | POA: Diagnosis not present

## 2015-12-18 HISTORY — PX: HEMORRHOID SURGERY: SHX153

## 2015-12-18 LAB — POCT PREGNANCY, URINE: Preg Test, Ur: NEGATIVE

## 2015-12-18 SURGERY — HEMORRHOIDECTOMY
Anesthesia: General | Site: Anus | Wound class: Contaminated

## 2015-12-18 MED ORDER — CEFAZOLIN SODIUM-DEXTROSE 2-4 GM/100ML-% IV SOLN
INTRAVENOUS | Status: AC
  Filled 2015-12-18: qty 100

## 2015-12-18 MED ORDER — LACTATED RINGERS IV SOLN
INTRAVENOUS | Status: DC
  Administered 2015-12-18: 15:00:00 via INTRAVENOUS

## 2015-12-18 MED ORDER — MIDAZOLAM HCL 2 MG/2ML IJ SOLN
INTRAMUSCULAR | Status: DC | PRN
  Administered 2015-12-18 (×2): 1.5 mg via INTRAVENOUS

## 2015-12-18 MED ORDER — SODIUM CHLORIDE 0.9 % IJ SOLN
INTRAMUSCULAR | Status: DC | PRN
  Administered 2015-12-18: 20 mL via INTRAVENOUS

## 2015-12-18 MED ORDER — KETOROLAC TROMETHAMINE 30 MG/ML IJ SOLN
INTRAMUSCULAR | Status: DC | PRN
  Administered 2015-12-18: 30 mg via INTRAVENOUS

## 2015-12-18 MED ORDER — BUPIVACAINE LIPOSOME 1.3 % IJ SUSP
INTRAMUSCULAR | Status: AC
  Filled 2015-12-18: qty 20

## 2015-12-18 MED ORDER — SODIUM CHLORIDE 0.9 % IJ SOLN
INTRAMUSCULAR | Status: AC
  Filled 2015-12-18: qty 50

## 2015-12-18 MED ORDER — FENTANYL CITRATE (PF) 100 MCG/2ML IJ SOLN: 25.0000 ug | INTRAMUSCULAR | Status: DC | PRN

## 2015-12-18 MED ORDER — PROPOFOL 10 MG/ML IV BOLUS
INTRAVENOUS | Status: DC | PRN
  Administered 2015-12-18: 120 mg via INTRAVENOUS

## 2015-12-18 MED ORDER — CHLORHEXIDINE GLUCONATE CLOTH 2 % EX PADS: 6.0000 | MEDICATED_PAD | Freq: Once | CUTANEOUS | Status: DC

## 2015-12-18 MED ORDER — FENTANYL CITRATE (PF) 100 MCG/2ML IJ SOLN
INTRAMUSCULAR | Status: DC | PRN
  Administered 2015-12-18 (×2): 50 ug via INTRAVENOUS

## 2015-12-18 MED ORDER — ONDANSETRON HCL 4 MG/2ML IJ SOLN
INTRAMUSCULAR | Status: AC
  Filled 2015-12-18: qty 2

## 2015-12-18 MED ORDER — CEFAZOLIN SODIUM-DEXTROSE 2-4 GM/100ML-% IV SOLN
2.0000 g | INTRAVENOUS | Status: AC
Start: 2015-12-19 — End: 2015-12-18
  Administered 2015-12-18: 2 g via INTRAVENOUS

## 2015-12-18 MED ORDER — ACETAMINOPHEN 10 MG/ML IV SOLN
INTRAVENOUS | Status: AC
  Filled 2015-12-18: qty 100

## 2015-12-18 MED ORDER — ONDANSETRON HCL 4 MG/2ML IJ SOLN
INTRAMUSCULAR | Status: DC | PRN
  Administered 2015-12-18: 4 mg via INTRAVENOUS

## 2015-12-18 MED ORDER — ONDANSETRON HCL 4 MG/2ML IJ SOLN
4.0000 mg | Freq: Once | INTRAMUSCULAR | Status: AC | PRN
  Administered 2015-12-18: 4 mg via INTRAVENOUS

## 2015-12-18 MED ORDER — LIDOCAINE HCL (CARDIAC) 20 MG/ML IV SOLN
INTRAVENOUS | Status: DC | PRN
  Administered 2015-12-18: 60 mg via INTRAVENOUS

## 2015-12-18 MED ORDER — BUPIVACAINE LIPOSOME 1.3 % IJ SUSP
INTRAMUSCULAR | Status: DC | PRN
  Administered 2015-12-18: 10 mL

## 2015-12-18 MED ORDER — BUPIVACAINE-EPINEPHRINE (PF) 0.25% -1:200000 IJ SOLN
INTRAMUSCULAR | Status: DC | PRN
  Administered 2015-12-18: 15 mL via PERINEURAL

## 2015-12-18 MED ORDER — DEXAMETHASONE SODIUM PHOSPHATE 10 MG/ML IJ SOLN
INTRAMUSCULAR | Status: DC | PRN
  Administered 2015-12-18: 5 mg via INTRAVENOUS

## 2015-12-18 MED ORDER — OXYCODONE-ACETAMINOPHEN 5-325 MG PO TABS: 1.0000 | ORAL_TABLET | ORAL | Status: DC | PRN

## 2015-12-18 MED ORDER — ACETAMINOPHEN 10 MG/ML IV SOLN
INTRAVENOUS | Status: DC | PRN
Start: 2015-12-18 — End: 2015-12-18
  Administered 2015-12-18: 1000 mg via INTRAVENOUS

## 2015-12-18 MED ORDER — BUPIVACAINE-EPINEPHRINE (PF) 0.25% -1:200000 IJ SOLN
INTRAMUSCULAR | Status: AC
  Filled 2015-12-18: qty 30

## 2015-12-18 SURGICAL SUPPLY — 20 items
BLADE CLIPPER SURG (BLADE) IMPLANT
BLADE SURG 15 STRL LF DISP TIS (BLADE) ×1 IMPLANT
BLADE SURG 15 STRL SS (BLADE) ×2
BRIEF STRETCH MATERNITY 2XLG (MISCELLANEOUS) ×1 IMPLANT
CANISTER SUCT 1200ML W/VALVE (MISCELLANEOUS) ×2 IMPLANT
DRAPE LAPAROTOMY 100X77 ABD (DRAPES) IMPLANT
DRAPE LEGGINS SURG 28X43 STRL (DRAPES) IMPLANT
DRAPE UNDER BUTTOCK W/FLU (DRAPES) IMPLANT
ELECT REM PT RETURN 9FT ADLT (ELECTROSURGICAL) ×2
ELECTRODE REM PT RTRN 9FT ADLT (ELECTROSURGICAL) ×1 IMPLANT
GLOVE BIO SURGEON STRL SZ7 (GLOVE) ×6 IMPLANT
GOWN STRL REUS W/ TWL LRG LVL3 (GOWN DISPOSABLE) ×2 IMPLANT
GOWN STRL REUS W/TWL LRG LVL3 (GOWN DISPOSABLE) ×4
NEEDLE HYPO 22GX1.5 SAFETY (NEEDLE) ×1 IMPLANT
PACK BASIN MINOR ARMC (MISCELLANEOUS) ×2 IMPLANT
PAD ABD DERMACEA PRESS 5X9 (GAUZE/BANDAGES/DRESSINGS) ×2 IMPLANT
PAD PREP 24X41 OB/GYN DISP (PERSONAL CARE ITEMS) ×2 IMPLANT
SOL PREP PVP 2OZ (MISCELLANEOUS) ×2
SOLUTION PREP PVP 2OZ (MISCELLANEOUS) ×1 IMPLANT
SYR 50ML LL SCALE MARK (SYRINGE) ×1 IMPLANT

## 2015-12-18 NOTE — Op Note (Signed)
  12/18/2015  4:09 PM  PATIENT:  Velna Ochs Lia  35 y.o. female  PRE-OPERATIVE DIAGNOSIS:  Thrombosed External hemorrhoid  POST-OPERATIVE DIAGNOSIS:  Same  PROCEDURE:  1. Incision and drainage of Large Thrombosed external hemorrhoid  SURGEON:  Surgeon(s) and Role:    * Amari Burnsworth Sarita Haver, MD - Primary   FINDINGS: RIGHT LATERAL THROMBOSED EXTERNAL HEMORRHOID  ANESTHESIA: GETA  INDICATIONS FOR PROCEDURE Thrombosed hemorrhoid  DICTATION:  Patient wasexplained about the procedure in detail, risks, benefits possible complications and a consent was obtained. The patient taken to the operating room and placed in a lithotomy position.  incision was created with 15 blade knife incorporating the anoderm in an elliptical fashion, we drained the clot out. All the loculations were breaking down.Hemostasis was obtained with electrocautery. Irrigation with normal saline and the wound was packed with half-inch packing. Marcaine quarter percent with epinephrine + Exparel was injected around the wound site. Needle and laparotomy counts were correct and there were no immediate complications  Jules Husbands, MD

## 2015-12-18 NOTE — Discharge Instructions (Signed)
AMBULATORY SURGERY  DISCHARGE INSTRUCTIONS   1) The drugs that you were given will stay in your system until tomorrow so for the next 24 hours you should not:  A) Drive an automobile B) Make any legal decisions C) Drink any alcoholic beverage   2) You may resume regular meals tomorrow.  Today it is better to start with liquids and gradually work up to solid foods.  You may eat anything you prefer, but it is better to start with liquids, then soup and crackers, and gradually work up to solid foods.   3) Please notify your doctor immediately if you have any unusual bleeding, trouble breathing, redness and pain at the surgery site, drainage, fever, or pain not relieved by medication.    4) Additional Instructions: Sitz bath for comfort  Please contact your physician with any problems or Same Day Surgery at [phone], Monday through Friday 6 am to 4 pm, or [place] at [facility] number at [phone].

## 2015-12-18 NOTE — Anesthesia Preprocedure Evaluation (Signed)
Anesthesia Evaluation  Patient identified by MRN, date of birth, ID band Patient awake    Reviewed: Allergy & Precautions, NPO status , Patient's Chart, lab work & pertinent test results  History of Anesthesia Complications Negative for: history of anesthetic complications  Airway Mallampati: II       Dental  (+) Teeth Intact   Pulmonary neg pulmonary ROS, former smoker,           Cardiovascular + dysrhythmias (PVCs) + Valvular Problems/Murmurs      Neuro/Psych Anxiety negative neurological ROS     GI/Hepatic negative GI ROS, Neg liver ROS,   Endo/Other  negative endocrine ROS  Renal/GU negative Renal ROS     Musculoskeletal   Abdominal   Peds  Hematology negative hematology ROS (+)   Anesthesia Other Findings   Reproductive/Obstetrics                             Anesthesia Physical Anesthesia Plan  ASA: II and emergent  Anesthesia Plan: General   Post-op Pain Management:    Induction: Intravenous  Airway Management Planned: LMA  Additional Equipment:   Intra-op Plan:   Post-operative Plan:   Informed Consent: I have reviewed the patients History and Physical, chart, labs and discussed the procedure including the risks, benefits and alternatives for the proposed anesthesia with the patient or authorized representative who has indicated his/her understanding and acceptance.     Plan Discussed with:   Anesthesia Plan Comments:         Anesthesia Quick Evaluation

## 2015-12-18 NOTE — Anesthesia Procedure Notes (Signed)
Procedure Name: LMA Insertion Date/Time: 12/18/2015 3:42 PM Performed by: Aline Brochure Pre-anesthesia Checklist: Patient identified, Emergency Drugs available, Suction available and Patient being monitored Patient Re-evaluated:Patient Re-evaluated prior to inductionOxygen Delivery Method: Circle system utilized Preoxygenation: Pre-oxygenation with 100% oxygen Intubation Type: IV induction Ventilation: Mask ventilation without difficulty LMA: LMA inserted LMA Size: 3.5 Number of attempts: 1 Placement Confirmation: positive ETCO2 Tube secured with: Tape Dental Injury: Teeth and Oropharynx as per pre-operative assessment

## 2015-12-18 NOTE — Progress Notes (Signed)
Patient ID: Ashley Hancock, female   DOB: [birth year], 35 y.o.   MRN: [id number]  CC: Thrombosed external hemorrhoid  HPI Ashley Hancock is a 35 y.o. female presents to clinic today for evaluation of a thrombosed external hemorrhoid. Patient states she's had numerous small hemorrhoids in the past that all resolved on her within 24 hours. This started late Saturday night versus early Sunday morning with a throbbing pain that has gradually intensified over the last 24 hours. This is unlike anything she's had before. She states "it feels like a walnut hanging out of the rectum". She denies any fevers, chills, nausea, vomiting, diarrhea, constipation. She is otherwise in her usual state of health other than this external hemorrhoid. Patient is an or nurse at [employer].  HPI  Past Medical History  Diagnosis Date  . Migraines   . Heart murmur   . IBS (irritable bowel syndrome)   . Anxiety   . Dysrhythmia     irreg HR and PVCs  . History of gastric ulcer   . Blood transfusion without reported diagnosis     1unit post laparotomy 2015    Past Surgical History  Procedure Laterality Date  . Cholecystectomy  2011  . Rhinoplasty  2012  . Laparoscopy    . Laparotomy      x2  . Appendectomy    . Wisdom tooth extraction      Family History  Problem Relation Age of Onset  . Hypertension Mother   . Thyroid disease Mother   . Bipolar disorder Mother   . Heart disease Father     CHF;MI  . Diabetes Father     Social History Social History  Substance Use Topics  . Smoking status: Former Smoker -- 1.00 packs/day for 12 years    Types: Cigarettes  . Smokeless tobacco: None  . Alcohol Use: No    Allergies  Allergen Reactions  . Bacitracin Rash    Blisters    Current Outpatient Prescriptions  Medication Sig Dispense Refill  . ibuprofen (ADVIL,MOTRIN) 600 MG tablet Take 1 tablet (600 mg total) by mouth every 6 (six) hours. 30 tablet 1  . oxyCODONE-acetaminophen (PERCOCET/ROXICET) 5-325  MG tablet Take 1 tablet by mouth every 4 (four) hours as needed (for pain scale 4-7). 30 tablet 0  . Prenatal Vit-Fe Fumarate-FA (MULTIVITAMIN-PRENATAL) 27-0.8 MG TABS tablet Take 1 tablet by mouth daily at 12 noon.    . sertraline (ZOLOFT) 50 MG tablet     . SUMAtriptan (IMITREX) 50 MG tablet      No current facility-administered medications for this visit.     Review of Systems A Multi-point review of systems was asked and was negative except for the findings are noted in the history of present illness  Physical Exam Blood pressure 141/80, pulse 76, temperature 98.4 F (36.9 C), temperature source Oral, height 5\' 5"  (1.651 m), weight 56.518 kg (124 lb 9.6 oz), last menstrual period 11/23/2015, unknown if currently breastfeeding. CONSTITUTIONAL: No acute distress EYES: Pupils are equal, round, and reactive to light, Sclera are non-icteric. EARS, NOSE, MOUTH AND THROAT: The oropharynx is clear. The oral mucosa is pink and moist. Hearing is intact to voice. LYMPH NODES:  Lymph nodes in the neck are normal. RESPIRATORY:  Lungs are clear. There is normal respiratory effort, with equal breath sounds bilaterally, and without pathologic use of accessory muscles. CARDIOVASCULAR: Heart is regular without murmurs, gallops, or rubs. GI: The abdomen is soft, nontender, and nondistended. There are no palpable masses.  There is no hepatosplenomegaly. There are normal bowel sounds in all quadrants. GU: Rectal exam shows a 1.5 x 1 cm erythematous external hemorrhoid at the 2:00 position. It is exquisitely tender to palpation and tense..   MUSCULOSKELETAL: Normal muscle strength and tone. No cyanosis or edema.   SKIN: Turgor is good and there are no pathologic skin lesions or ulcers. NEUROLOGIC: Motor and sensation is grossly normal. Cranial nerves are grossly intact. PSYCH:  Oriented to person, place and time. Affect is normal.  Data Reviewed No images or labs to review    Assessment    Thrombosed  external hemorrhoid    Plan    35 year old female with a thrombosed external hemorrhoid for approximately 32 hours. 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 She has been nothing by mouth all day. Given the timeframe and the severity of her tenderness patient desires to have it evacuated the operating room. The procedure was discussed in detail the patient desires to proceed. We will send her to same day surgery where the surgery was performed by Dr. Dahlia Byes who is the on-call provider. All questions answered to patient's satisfaction.     Time spent with the patient was 30 minutes, with more than 50% of the time spent in face-to-face education, counseling and care coordination.     Clayburn Pert, MD FACS General Surgeon 12/18/2015, 2:21 PM

## 2015-12-18 NOTE — Anesthesia Postprocedure Evaluation (Signed)
Anesthesia Post Note  Patient: [name]  Procedure(s) Performed: Procedure(s) (LRB):  I & D THROMBUS HEMORRHOID (N/A)  Patient location during evaluation: PACU Anesthesia Type: General Level of consciousness: awake and alert Pain management: pain level controlled Vital Signs Assessment: post-procedure vital signs reviewed and stable Respiratory status: spontaneous breathing, nonlabored ventilation, respiratory function stable and patient connected to nasal cannula oxygen Cardiovascular status: blood pressure returned to baseline and stable Postop Assessment: no signs of nausea or vomiting Anesthetic complications: no    Last Vitals:  Filed Vitals:   12/18/15 1657 12/18/15 1717  BP: 133/99 121/77  Pulse: 79   Temp: 36.7 C   Resp: 16 16    Last Pain: There were no vitals filed for this visit.               Martha Clan

## 2015-12-18 NOTE — Patient Instructions (Signed)
We are sending you to the same day surgery now to have your surgery done. Please call our office if you have any questions or concerns.

## 2015-12-18 NOTE — H&P (Signed)
Preoperative Informed Consent Review   I have discussed the risks, benefits, and options for this particular procedure with Ashley Hancock and family.  She understands the risk for this operation and the alternatives for intervention.  She is in agreement.  She has had her questions answered to her stated satisfaction.  Patient agrees to proceed with this procedure at this time.  Clayburn Pert MD FACS

## 2015-12-18 NOTE — Transfer of Care (Signed)
Immediate Anesthesia Transfer of Care Note  Patient: Ashley Hancock  Procedure(s) Performed: Procedure(s):  I & D THROMBUS HEMORRHOID (N/A)  Patient Location: PACU  Anesthesia Type:General  Level of Consciousness: awake, alert  and oriented  Airway & Oxygen Therapy: Patient Spontanous Breathing  Post-op Assessment: Post -op Vital signs reviewed and stable  Post vital signs: stable  Last Vitals:  Filed Vitals:   12/18/15 1514 12/18/15 1621  BP: 139/83   Pulse: 86   Temp: 37.7 C 36.4 C  Resp: 16     Last Pain: There were no vitals filed for this visit.       Complications: No apparent anesthesia complications

## 2015-12-19 ENCOUNTER — Encounter: Payer: Self-pay | Admitting: Surgery

## 2015-12-20 LAB — SURGICAL PATHOLOGY

## 2015-12-21 ENCOUNTER — Ambulatory Visit: Payer: Self-pay | Admitting: General Surgery

## 2016-01-05 ENCOUNTER — Encounter: Payer: Self-pay | Admitting: Surgery

## 2016-01-05 ENCOUNTER — Other Ambulatory Visit: Payer: Self-pay

## 2016-01-05 ENCOUNTER — Ambulatory Visit (INDEPENDENT_AMBULATORY_CARE_PROVIDER_SITE_OTHER): Payer: 59 | Admitting: Surgery

## 2016-01-05 VITALS — BP 122/83 | HR 82 | Temp 98.3°F | Ht 65.0 in | Wt 126.0 lb

## 2016-01-05 DIAGNOSIS — K645 Perianal venous thrombosis: Secondary | ICD-10-CM

## 2016-01-05 NOTE — Progress Notes (Signed)
35 yr old female s/p External hemorrhoid excisions.  Patient doing well now.  She states appetite is good and eating well. Patient states that she is having normal soft bowel movements. Patient states that she is not having any further drainage from the area. Patient states that she still a small amount of pain but that is much better.  Filed Vitals:   01/05/16 1000  BP: 122/83  Pulse: 82  Temp: 98.3 F (36.8 C)   PE:  Gen: NAD Rectum: small area of healing mucosa on right posterior area approximately 1cm in size, good healthy tissue, otherwise normal rectum   A/P:  Patient healing well. Instructed to continue to attempt to drink plenty of water and good fiber in her diet to make sure she doesn't have any constipation. Patient is to call she has any questions or concerns.

## 2016-01-05 NOTE — Patient Instructions (Signed)
Please call with any questions or concerns.

## 2016-01-29 DIAGNOSIS — Z30431 Encounter for routine checking of intrauterine contraceptive device: Secondary | ICD-10-CM | POA: Diagnosis not present

## 2016-03-14 DIAGNOSIS — G43909 Migraine, unspecified, not intractable, without status migrainosus: Secondary | ICD-10-CM | POA: Diagnosis not present

## 2016-03-14 DIAGNOSIS — Z6820 Body mass index (BMI) 20.0-20.9, adult: Secondary | ICD-10-CM | POA: Diagnosis not present

## 2016-03-14 DIAGNOSIS — F419 Anxiety disorder, unspecified: Secondary | ICD-10-CM | POA: Diagnosis not present

## 2016-04-22 ENCOUNTER — Encounter: Payer: Self-pay | Admitting: Physician Assistant

## 2016-04-22 ENCOUNTER — Ambulatory Visit: Payer: Self-pay | Admitting: Physician Assistant

## 2016-04-22 VITALS — BP 112/70 | HR 60 | Temp 98.2°F

## 2016-04-22 DIAGNOSIS — H6981 Other specified disorders of Eustachian tube, right ear: Secondary | ICD-10-CM

## 2016-04-22 MED ORDER — FLUTICASONE PROPIONATE 50 MCG/ACT NA SUSP: 2.0000 | Freq: Every day | NASAL | 6 refills | Status: DC

## 2016-04-22 MED ORDER — METHYLPREDNISOLONE 4 MG PO TBPK: ORAL_TABLET | ORAL | 0 refills | Status: DC

## 2016-04-22 NOTE — Progress Notes (Signed)
S:  C/o  r ear popping and being stopped up, no drainage from ears, no fever/chills, no cough or congestion, some sinus pressure, remainder ros neg Using otc meds without relief  O:  Vitals wnl, nad, tms dull b/l, bulging on r; nasal mucosa swollen, throat wnl, neck supple no lymph, lungs c t a, cv rrr, neuro intact  A: acute eustachean tube dysfunction  P: flonase, sudafed, medrol dose pack,  return if not improving in 3 to 5 days, return earlier if worsening. If not better by Wednesday will call in an antibiotic

## 2016-05-02 DIAGNOSIS — F418 Other specified anxiety disorders: Secondary | ICD-10-CM | POA: Diagnosis not present

## 2016-05-02 DIAGNOSIS — Z720 Tobacco use: Secondary | ICD-10-CM | POA: Diagnosis not present

## 2016-05-02 DIAGNOSIS — Z6820 Body mass index (BMI) 20.0-20.9, adult: Secondary | ICD-10-CM | POA: Diagnosis not present

## 2016-05-02 DIAGNOSIS — G43909 Migraine, unspecified, not intractable, without status migrainosus: Secondary | ICD-10-CM | POA: Diagnosis not present

## 2016-07-08 HISTORY — PX: AUGMENTATION MAMMAPLASTY: SUR837

## 2017-01-30 DIAGNOSIS — L301 Dyshidrosis [pompholyx]: Secondary | ICD-10-CM | POA: Diagnosis not present

## 2017-01-30 DIAGNOSIS — L2089 Other atopic dermatitis: Secondary | ICD-10-CM | POA: Diagnosis not present

## 2017-03-06 ENCOUNTER — Other Ambulatory Visit
Admission: RE | Admit: 2017-03-06 | Discharge: 2017-03-06 | Disposition: A | Discharge status: Home / Self Care | Payer: 59 | Source: Ambulatory Visit | Attending: Internal Medicine | Admitting: Internal Medicine

## 2017-03-06 DIAGNOSIS — N809 Endometriosis, unspecified: Secondary | ICD-10-CM | POA: Diagnosis not present

## 2017-03-06 DIAGNOSIS — Z0000 Encounter for general adult medical examination without abnormal findings: Secondary | ICD-10-CM | POA: Diagnosis not present

## 2017-03-06 DIAGNOSIS — Z8249 Family history of ischemic heart disease and other diseases of the circulatory system: Secondary | ICD-10-CM | POA: Diagnosis not present

## 2017-03-06 DIAGNOSIS — G43909 Migraine, unspecified, not intractable, without status migrainosus: Secondary | ICD-10-CM | POA: Diagnosis not present

## 2017-03-06 DIAGNOSIS — F419 Anxiety disorder, unspecified: Secondary | ICD-10-CM | POA: Insufficient documentation

## 2017-03-06 LAB — CBC WITH DIFFERENTIAL/PLATELET
Basophils Absolute: 0.1 10*3/uL (ref 0–0.1)
Basophils Relative: 2 %
EOS PCT: 1 %
Eosinophils Absolute: 0.1 10*3/uL (ref 0–0.7)
HEMATOCRIT: 42.8 % (ref 35.0–47.0)
Hemoglobin: 14.8 g/dL (ref 12.0–16.0)
LYMPHS PCT: 26 %
Lymphs Abs: 1.4 10*3/uL (ref 1.0–3.6)
MCH: 31.6 pg (ref 26.0–34.0)
MCHC: 34.7 g/dL (ref 32.0–36.0)
MCV: 91.1 fL (ref 80.0–100.0)
MONO ABS: 0.4 10*3/uL (ref 0.2–0.9)
Monocytes Relative: 8 %
NEUTROS ABS: 3.3 10*3/uL (ref 1.4–6.5)
Neutrophils Relative %: 63 %
Platelets: 271 10*3/uL (ref 150–440)
RBC: 4.7 MIL/uL (ref 3.80–5.20)
RDW: 12.7 % (ref 11.5–14.5)
WBC: 5.2 10*3/uL (ref 3.6–11.0)

## 2017-03-06 LAB — BASIC METABOLIC PANEL
ANION GAP: 8 (ref 5–15)
BUN: 10 mg/dL (ref 6–20)
CALCIUM: 8.9 mg/dL (ref 8.9–10.3)
CO2: 26 mmol/L (ref 22–32)
Chloride: 104 mmol/L (ref 101–111)
Creatinine, Ser: 0.61 mg/dL (ref 0.44–1.00)
Glucose, Bld: 93 mg/dL (ref 65–99)
POTASSIUM: 3.7 mmol/L (ref 3.5–5.1)
Sodium: 138 mmol/L (ref 135–145)

## 2017-03-06 LAB — LIPID PANEL
CHOL/HDL RATIO: 3.4 ratio
Cholesterol: 177 mg/dL (ref 0–200)
HDL: 52 mg/dL (ref >40)
LDL Cholesterol: 110 mg/dL — ABNORMAL HIGH (ref 0–99)
TRIGLYCERIDES: 75 mg/dL (ref <150)
VLDL: 15 mg/dL (ref 0–40)

## 2017-03-06 LAB — HEPATIC FUNCTION PANEL
ALT: 12 U/L — ABNORMAL LOW (ref 14–54)
AST: 16 U/L (ref 15–41)
Albumin: 4.4 g/dL (ref 3.5–5.0)
Alkaline Phosphatase: 45 U/L (ref 38–126)
Bilirubin, Direct: <0.1 mg/dL — ABNORMAL LOW (ref 0.1–0.5)
Total Bilirubin: 0.6 mg/dL (ref 0.3–1.2)
Total Protein: 7.3 g/dL (ref 6.5–8.1)

## 2017-03-06 LAB — TSH: TSH: 0.711 u[IU]/mL (ref 0.350–4.500)

## 2017-03-07 DIAGNOSIS — K645 Perianal venous thrombosis: Secondary | ICD-10-CM | POA: Diagnosis not present

## 2017-03-07 DIAGNOSIS — D126 Benign neoplasm of colon, unspecified: Secondary | ICD-10-CM | POA: Diagnosis not present

## 2017-03-07 DIAGNOSIS — F418 Other specified anxiety disorders: Secondary | ICD-10-CM | POA: Diagnosis not present

## 2017-03-07 DIAGNOSIS — N808 Other endometriosis: Secondary | ICD-10-CM | POA: Diagnosis not present

## 2017-03-07 DIAGNOSIS — Z0000 Encounter for general adult medical examination without abnormal findings: Secondary | ICD-10-CM | POA: Diagnosis not present

## 2017-03-07 DIAGNOSIS — G43909 Migraine, unspecified, not intractable, without status migrainosus: Secondary | ICD-10-CM | POA: Diagnosis not present

## 2017-03-07 DIAGNOSIS — Z720 Tobacco use: Secondary | ICD-10-CM | POA: Diagnosis not present

## 2017-03-07 DIAGNOSIS — Z8249 Family history of ischemic heart disease and other diseases of the circulatory system: Secondary | ICD-10-CM | POA: Diagnosis not present

## 2017-03-07 DIAGNOSIS — Z1389 Encounter for screening for other disorder: Secondary | ICD-10-CM | POA: Diagnosis not present

## 2017-03-07 LAB — VITAMIN D 25 HYDROXY (VIT D DEFICIENCY, FRACTURES): Vit D, 25-Hydroxy: 48.2 ng/mL (ref 30.0–100.0)

## 2017-04-28 ENCOUNTER — Telehealth: Payer: 59 | Admitting: Family

## 2017-04-28 DIAGNOSIS — R05 Cough: Secondary | ICD-10-CM

## 2017-04-28 DIAGNOSIS — R059 Cough, unspecified: Secondary | ICD-10-CM

## 2017-04-28 MED ORDER — AZITHROMYCIN 250 MG PO TABS: ORAL_TABLET | ORAL | 0 refills | Status: DC

## 2017-04-28 MED ORDER — BENZONATATE 100 MG PO CAPS: 100.0000 mg | ORAL_CAPSULE | Freq: Three times a day (TID) | ORAL | 0 refills | Status: DC | PRN

## 2017-04-28 NOTE — Progress Notes (Signed)

## 2017-05-01 DIAGNOSIS — D2239 Melanocytic nevi of other parts of face: Secondary | ICD-10-CM | POA: Diagnosis not present

## 2017-05-01 DIAGNOSIS — L2089 Other atopic dermatitis: Secondary | ICD-10-CM | POA: Diagnosis not present

## 2017-05-16 ENCOUNTER — Telehealth: Payer: 59 | Admitting: Nurse Practitioner

## 2017-05-16 DIAGNOSIS — M5440 Lumbago with sciatica, unspecified side: Secondary | ICD-10-CM | POA: Diagnosis not present

## 2017-05-16 MED ORDER — NAPROXEN 500 MG PO TABS: 500.0000 mg | ORAL_TABLET | Freq: Two times a day (BID) | ORAL | 1 refills | Status: DC

## 2017-05-16 MED ORDER — CYCLOBENZAPRINE HCL 10 MG PO TABS: 10.0000 mg | ORAL_TABLET | Freq: Three times a day (TID) | ORAL | 1 refills | Status: DC | PRN

## 2017-05-16 NOTE — Progress Notes (Signed)
We are sorry that you are not feeling well.  Here is how we plan to help!  Based on what you have shared with me it looks like you mostly have acute back pain.  Acute back pain is defined as musculoskeletal pain that can resolve in 1-3 weeks with conservative treatment.  I have prescribed Naprosyn 500 mg twice a day non-steroid anti-inflammatory (NSAID) as well as Flexeril 10 mg every eight hours as needed which is a muscle relaxer  Some patients experience stomach irritation or in increased heartburn with anti-inflammatory drugs.  Please keep in mind that muscle relaxer's can cause fatigue and should not be taken while at work or driving.  Back pain is very common.  The pain often gets better over time.  The cause of back pain is usually not dangerous.  Most people can learn to manage their back pain on their own.  ** be careful with flexeril. It may cause sedation but I think it may help with cramping from endometriosis. Naprosyn is the strongest thing I can do pain wise.  Home Care  Stay active.  Start with short walks on flat ground if you can.  Try to walk farther each day.  Do not sit, drive or stand in one place for more than 30 minutes.  Do not stay in bed.  Do not avoid exercise or work.  Activity can help your back heal faster.  Be careful when you bend or lift an object.  Bend at your knees, keep the object close to you, and do not twist.  Sleep on a firm mattress.  Lie on your side, and bend your knees.  If you lie on your back, put a pillow under your knees.  Only take medicines as told by your doctor.  Put ice on the injured area.  Put ice in a plastic bag  Place a towel between your skin and the bag  Leave the ice on for 15-20 minutes, 3-4 times a day for the first 2-3 days. 210 After that, you can switch between ice and heat packs.  Ask your doctor about back exercises or massage.  Avoid feeling anxious or stressed.  Find good ways to deal with stress, such as  exercise.  Get Help Right Way If:  Your pain does not go away with rest or medicine.  Your pain does not go away in 1 week.  You have new problems.  You do not feel well.  The pain spreads into your legs.  You cannot control when you poop (bowel movement) or pee (urinate)  You feel sick to your stomach (nauseous) or throw up (vomit)  You have belly (abdominal) pain.  You feel like you may pass out (faint).  If you develop a fever.  Make Sure you:  Understand these instructions.  Will watch your condition  Will get help right away if you are not doing well or get worse.  Your e-visit answers were reviewed by a board certified advanced clinical practitioner to complete your personal care plan.  Depending on the condition, your plan could have included both over the counter or prescription medications.  If there is a problem please reply  once you have received a response from your provider.  Your safety is important to [place].  If you have drug allergies check your prescription carefully.    You can use MyChart to ask questions about today's visit, request a non-urgent call back, or ask for a work or school excuse for  24 hours related to this e-Visit. If it has been greater than 24 hours you will need to follow up with your provider, or enter a new e-Visit to address those concerns.  You will get an e-mail in the next two days asking about your experience.  I hope that your e-visit has been valuable and will speed your recovery. Thank you for using e-visits.

## 2017-08-01 DIAGNOSIS — Z6820 Body mass index (BMI) 20.0-20.9, adult: Secondary | ICD-10-CM | POA: Diagnosis not present

## 2017-08-01 DIAGNOSIS — Z01419 Encounter for gynecological examination (general) (routine) without abnormal findings: Secondary | ICD-10-CM | POA: Diagnosis not present

## 2017-08-01 DIAGNOSIS — Z3202 Encounter for pregnancy test, result negative: Secondary | ICD-10-CM | POA: Diagnosis not present

## 2017-08-08 ENCOUNTER — Ambulatory Visit: Payer: 59 | Attending: Internal Medicine | Admitting: Pharmacist

## 2017-08-08 ENCOUNTER — Encounter: Payer: Self-pay | Admitting: Pharmacist

## 2017-08-08 DIAGNOSIS — Z79899 Other long term (current) drug therapy: Secondary | ICD-10-CM

## 2017-08-08 MED ORDER — DUPILUMAB 300 MG/2ML ~~LOC~~ SOSY: 1.0000 | PREFILLED_SYRINGE | SUBCUTANEOUS | 2 refills | Status: DC

## 2017-08-08 NOTE — Progress Notes (Signed)
   S: Patient presents to [facility] for review of their specialty medication therapy.  Patient is currently taking Dupixent for atopic dermatitis. Patient is managed by Dr. Fontaine No for this.   Adherence:   Efficacy:  Dosing: 300 mg every 2 weeks  Drug-drug interactions: none  Screening:   Monitoring: Local injection site reactions:  Hypersensitivity: Eosinophilia: Ocular effects: S/sx of parasitic infections:  O:     Lab Results  Component Value Date   WBC 5.2 03/06/2017   HGB 14.8 03/06/2017   HCT 42.8 03/06/2017   MCV 91.1 03/06/2017   PLT 271 03/06/2017      Chemistry      Component Value Date/Time   NA 138 03/06/2017 0805   NA 139 03/01/2014 1131   K 3.7 03/06/2017 0805   K 4.0 03/01/2014 1131   CL 104 03/06/2017 0805   CL 108 (H) 03/01/2014 1131   CO2 26 03/06/2017 0805   CO2 23 03/01/2014 1131   BUN 10 03/06/2017 0805   BUN 5 (L) 03/01/2014 1131   CREATININE 0.61 03/06/2017 0805   CREATININE 0.60 03/01/2014 1131      Component Value Date/Time   CALCIUM 8.9 03/06/2017 0805   CALCIUM 9.0 03/01/2014 1131   ALKPHOS 45 03/06/2017 0805   ALKPHOS 81 05/22/2013 2040   AST 16 03/06/2017 0805   AST 40 (H) 05/22/2013 2040   ALT 12 (L) 03/06/2017 0805   ALT 84 (H) 05/22/2013 2040   BILITOT 0.6 03/06/2017 0805   BILITOT 0.3 05/22/2013 2040       A/P: 1. Medication review: Patient currently on [place] for atopic dermatitis and is tolerating it well with no adverse effects. Reviewed the medication, including the following: Dupixent is a monoclonal antibody used to decrease inflammation. Possible adverse effects include injection site reactions, ocular adverse effects, eosinophilia, vasculitis, and increased risk of parasitic infections. No recommendations for any changes.    Christella Hartigan, PharmD, BCPS, BCACP, [facility] [phone]

## 2017-08-13 DIAGNOSIS — H6501 Acute serous otitis media, right ear: Secondary | ICD-10-CM | POA: Diagnosis not present

## 2017-09-16 MED FILL — DUPIXENT 300 MG/2 ML SAFE S: 300 | 28 days supply | Qty: 4 | Fill #0

## 2017-10-03 DIAGNOSIS — J209 Acute bronchitis, unspecified: Secondary | ICD-10-CM | POA: Diagnosis not present

## 2017-10-03 DIAGNOSIS — J018 Other acute sinusitis: Secondary | ICD-10-CM | POA: Diagnosis not present

## 2017-10-15 MED FILL — DUPIXENT 300 MG/2 ML SAFE S: 300 | 28 days supply | Qty: 4 | Fill #1

## 2017-10-31 DIAGNOSIS — L2089 Other atopic dermatitis: Secondary | ICD-10-CM | POA: Diagnosis not present

## 2017-10-31 DIAGNOSIS — Z79899 Other long term (current) drug therapy: Secondary | ICD-10-CM | POA: Diagnosis not present

## 2017-10-31 DIAGNOSIS — L908 Other atrophic disorders of skin: Secondary | ICD-10-CM | POA: Diagnosis not present

## 2017-11-19 MED FILL — DUPIXENT 300 MG/2 ML SAFE S: 300 | 28 days supply | Qty: 4 | Fill #2

## 2017-12-16 ENCOUNTER — Telehealth: Payer: Self-pay

## 2017-12-16 DIAGNOSIS — N63 Unspecified lump in unspecified breast: Secondary | ICD-10-CM

## 2017-12-16 NOTE — Telephone Encounter (Signed)
Notified patient of the below appointments. Patient verbalized understanding.  12/23/17 @ 3 pm- Mammogram / [place]  01/05/18 Dr.Pabon @ 10:15 am

## 2017-12-19 ENCOUNTER — Other Ambulatory Visit: Payer: Self-pay | Admitting: Internal Medicine

## 2017-12-19 MED ORDER — DUPILUMAB 300 MG/2ML ~~LOC~~ SOSY: 300.0000 mg | PREFILLED_SYRINGE | SUBCUTANEOUS | 3 refills | Status: DC

## 2017-12-22 ENCOUNTER — Ambulatory Visit: Payer: Self-pay | Admitting: Surgery

## 2017-12-22 MED FILL — DUPIXENT 300 MG/2 ML SAFE S: 300 | 28 days supply | Qty: 4 | Fill #0

## 2017-12-23 ENCOUNTER — Ambulatory Visit
Admission: RE | Admit: 2017-12-23 | Discharge: 2017-12-23 | Disposition: A | Discharge status: Home / Self Care | Payer: 59 | Source: Ambulatory Visit | Attending: Surgery | Admitting: Surgery

## 2017-12-23 ENCOUNTER — Other Ambulatory Visit: Payer: Self-pay | Admitting: Surgery

## 2017-12-23 DIAGNOSIS — N6314 Unspecified lump in the right breast, lower inner quadrant: Secondary | ICD-10-CM | POA: Diagnosis not present

## 2017-12-23 DIAGNOSIS — N63 Unspecified lump in unspecified breast: Secondary | ICD-10-CM

## 2017-12-23 DIAGNOSIS — R922 Inconclusive mammogram: Secondary | ICD-10-CM | POA: Diagnosis not present

## 2017-12-23 DIAGNOSIS — N6310 Unspecified lump in the right breast, unspecified quadrant: Secondary | ICD-10-CM

## 2017-12-23 DIAGNOSIS — N6313 Unspecified lump in the right breast, lower outer quadrant: Secondary | ICD-10-CM | POA: Diagnosis not present

## 2017-12-23 DIAGNOSIS — R928 Other abnormal and inconclusive findings on diagnostic imaging of breast: Secondary | ICD-10-CM

## 2017-12-30 ENCOUNTER — Ambulatory Visit
Admission: RE | Admit: 2017-12-30 | Discharge: 2017-12-30 | Disposition: A | Discharge status: Home / Self Care | Payer: 59 | Source: Ambulatory Visit | Attending: Surgery | Admitting: Surgery

## 2017-12-30 DIAGNOSIS — N6310 Unspecified lump in the right breast, unspecified quadrant: Secondary | ICD-10-CM | POA: Insufficient documentation

## 2017-12-30 DIAGNOSIS — R928 Other abnormal and inconclusive findings on diagnostic imaging of breast: Secondary | ICD-10-CM

## 2018-01-02 ENCOUNTER — Ambulatory Visit: Payer: Self-pay | Admitting: Surgery

## 2018-01-05 ENCOUNTER — Ambulatory Visit: Payer: Self-pay | Admitting: Surgery

## 2018-01-06 ENCOUNTER — Telehealth: Payer: Self-pay

## 2018-01-06 NOTE — Telephone Encounter (Signed)
Spoke with patient at this time- she stated the Radiologist said the area in breast was benign and to follow up in 6 months.  Patient said she would speak with Dr.Pabon today in surgery.

## 2018-01-06 NOTE — Telephone Encounter (Signed)
Called patient as to 01/05/18 appointment being cancelled per herself.

## 2018-01-10 DIAGNOSIS — H5203 Hypermetropia, bilateral: Secondary | ICD-10-CM | POA: Diagnosis not present

## 2018-01-26 MED FILL — DUPIXENT 300 MG/2 ML SAFE S: 300 | 28 days supply | Qty: 4 | Fill #1

## 2018-01-28 DIAGNOSIS — L720 Epidermal cyst: Secondary | ICD-10-CM | POA: Diagnosis not present

## 2018-01-28 DIAGNOSIS — D485 Neoplasm of uncertain behavior of skin: Secondary | ICD-10-CM | POA: Diagnosis not present

## 2018-02-20 MED FILL — DUPIXENT 300 MG/2 ML SAFE S: 300 | 28 days supply | Qty: 4 | Fill #2

## 2018-03-04 ENCOUNTER — Encounter: Payer: Self-pay | Admitting: General Surgery

## 2018-03-04 ENCOUNTER — Ambulatory Visit (INDEPENDENT_AMBULATORY_CARE_PROVIDER_SITE_OTHER): Payer: 59

## 2018-03-04 ENCOUNTER — Ambulatory Visit: Payer: 59 | Admitting: General Surgery

## 2018-03-04 DIAGNOSIS — N6320 Unspecified lump in the left breast, unspecified quadrant: Secondary | ICD-10-CM | POA: Insufficient documentation

## 2018-03-04 DIAGNOSIS — N61 Mastitis without abscess: Secondary | ICD-10-CM | POA: Diagnosis not present

## 2018-03-04 DIAGNOSIS — N63 Unspecified lump in unspecified breast: Secondary | ICD-10-CM

## 2018-03-04 NOTE — Patient Instructions (Signed)
Return as needed.The patient is aware to call back for any questions or concerns.  

## 2018-03-04 NOTE — Progress Notes (Signed)
Patient ID: Ashley Hancock, female   DOB: 04/08/1981, 37 y.o.   MRN: [id number]  Chief Complaint  Patient presents with  . Procedure    HPI Ashley Hancock is a 37 y.o. female here today for excision right breast.  The patient was seen last week and began a course of oral Augmentin.  Initially there was partial resolution of the erythema and drainage, but the erythema has gradually crept back.  Plans are for excision. HPI  Past Medical History:  Diagnosis Date  . Anxiety   . Blood transfusion without reported diagnosis    1unit post laparotomy 2015  . Dysrhythmia    irreg HR and PVCs  . Heart murmur   . History of gastric ulcer   . IBS (irritable bowel syndrome)   . Migraines     Past Surgical History:  Procedure Laterality Date  . APPENDECTOMY    . AUGMENTATION MAMMAPLASTY Bilateral 2018  . CHOLECYSTECTOMY  2011  . HEMORRHOID SURGERY N/A 12/18/2015   Procedure:  I & D THROMBUS HEMORRHOID;  Surgeon: Jules Husbands, MD;  Location: [facility] ORS;  Service: General;  Laterality: N/A;  . LAPAROSCOPY    . LAPAROTOMY     x2  . RHINOPLASTY  2012  . WISDOM TOOTH EXTRACTION      Family History  Problem Relation Age of Onset  . Hypertension Mother   . Thyroid disease Mother   . Bipolar disorder Mother   . Heart disease Father        CHF;MI  . Diabetes Father   . Breast cancer Neg Hx     Social History Social History   Tobacco Use  . Smoking status: Current Every Day Smoker    Packs/day: 1.00    Years: 12.00    Pack years: 12.00    Types: Cigarettes  . Smokeless tobacco: Never Used  Substance Use Topics  . Alcohol use: No    Alcohol/week: 1.0 - 3.0 standard drinks    Types: 1 - 3 Glasses of wine per week  . Drug use: No    Allergies  Allergen Reactions  . Bacitracin Rash    Blisters    Current Outpatient Medications  Medication Sig Dispense Refill  . cyclobenzaprine (FLEXERIL) 10 MG tablet Take 1 tablet (10 mg total) 3 (three) times daily as needed by mouth  for muscle spasms. 30 tablet 1  . Dupilumab (DUPIXENT) 300 MG/2ML SOSY Inject 300 mg into the skin every 14 (fourteen) days. Inject below the skin as directed 4 mL 3  . levonorgestrel (MIRENA) 20 MCG/24HR IUD 1 each by Intrauterine route once.     No current facility-administered medications for this visit.     Review of Systems Review of Systems  Constitutional: Negative.   Respiratory: Negative.   Cardiovascular: Negative.     Blood pressure 120/72, pulse 82, resp. rate 12, height 5\' 6"  (1.676 m), weight 125 lb (56.7 kg).  Physical Exam Physical Exam  Constitutional: She is oriented to person, place, and time. She appears well-developed and well-nourished.  Pulmonary/Chest:    Neurological: She is alert and oriented to person, place, and time.  Skin: Skin is warm and dry.    Data Reviewed Ultrasound examination of the area showed a process confined to the dermis and subcutaneous fat measuring 0.34 x 0.48 x 0.73 cm.    Assessment    Cutaneous abscess along previous mastopexy line.    Plan  The area was cleansed with alcohol and 10 cc  of 0.5% Xylocaine with 0.25% Marcaine with 1 to 200,000 units of epinephrine was utilized and well-tolerated.  ChloraPrep was applied to the skin.  3 cc of 1% plain Xylocaine was used to supplement.  The ulcerated area was excised in elliptical incision approximately 1.2 cm in length.  The skin was incised sharply and the remaining dissection completed with a knife.  The underlying subcutaneous fat was unremarkable.  The specimen was sent for routine histology.  The defect was closed with interrupted 4-0 Prolene simple sutures.  Telfa and Tegaderm dressing applied.  Ice pack provided.  The patient will complete the remaining 2 days of Augmentin therapy.  Tylenol for comfort.  She will return for suture removal in a week or have it completed by 1 of her nursing colleagues at the hospital.  Return as needed.   HPI, Physical Exam, Assessment  and Plan have been scribed under the direction and in the presence of Hervey Ard, MD.  Gaspar Cola, CMA  I have completed the exam and reviewed the above documentation for accuracy and completeness.  I agree with the above.  Haematologist has been used and any errors in dictation or transcription are unintentional.  Hervey Ard, M.D., F.A.C.S.   Ashley Hancock 03/04/2018, 5:47 PM

## 2018-03-06 ENCOUNTER — Telehealth: Payer: Self-pay | Admitting: General Surgery

## 2018-03-06 NOTE — Telephone Encounter (Signed)
The patient was notified that the biopsy results were benign.  Densely inflamed tissue.  She reported that she had had significant bleeding the night of surgery requiring a dressing change, less so the following morning.  None since.  Essentially pain-free.

## 2018-03-16 MED FILL — DUPIXENT 300 MG/2 ML SAFE S: 300 | 28 days supply | Qty: 4 | Fill #3

## 2018-03-18 ENCOUNTER — Telehealth: Payer: 59 | Admitting: Family

## 2018-03-18 DIAGNOSIS — R1115 Cyclical vomiting syndrome unrelated to migraine: Secondary | ICD-10-CM

## 2018-03-18 DIAGNOSIS — G43A0 Cyclical vomiting, not intractable: Secondary | ICD-10-CM | POA: Diagnosis not present

## 2018-03-18 MED ORDER — PROMETHAZINE HCL 25 MG PO TABS: 25.0000 mg | ORAL_TABLET | Freq: Three times a day (TID) | ORAL | 0 refills | Status: DC | PRN

## 2018-03-18 NOTE — Progress Notes (Signed)
We are sorry that you are not feeling well. Here is how we plan to help!  Based on what you have shared with me it looks like you have a Virus that is irritating your GI tract.  Vomiting is the forceful emptying of a portion of the stomach's content through the mouth.  Although nausea and vomiting can make you feel miserable, it's important to remember that these are not diseases, but rather symptoms of an underlying illness.  When we treat short term symptoms, we always caution that any symptoms that persist should be fully evaluated in a medical office.  I have prescribed a medication that will help alleviate your symptoms and allow you to stay hydrated:  Promethazine 25 mg take 1 tablet twice daily  HOME CARE:  Drink clear liquids.  This is very important! Dehydration (the lack of fluid) can lead to a serious complication.  Start off with 1 tablespoon every 5 minutes for 8 hours.  You may begin eating bland foods after 8 hours without vomiting.  Start with saltine crackers, white bread, rice, mashed potatoes, applesauce.  After 48 hours on a bland diet, you may resume a normal diet.  Try to go to sleep.  Sleep often empties the stomach and relieves the need to vomit.  GET HELP RIGHT AWAY IF:   Your symptoms do not improve or worsen within 2 days after treatment.  You have a fever for over 3 days.  You cannot keep down fluids after trying the medication.  MAKE SURE YOU:   Understand these instructions.  Will watch your condition.  Will get help right away if you are not doing well or get worse.   Thank you for choosing an e-visit. Your e-visit answers were reviewed by a board certified advanced clinical practitioner to complete your personal care plan. Depending upon the condition, your plan could have included both over the counter or prescription medications. Please review your pharmacy choice. Be sure that the pharmacy you have chosen is open so that you can pick up your  prescription now.  If there is a problem you may message your provider in MyChart to have the prescription routed to another pharmacy. Your safety is important to us. If you have drug allergies check your prescription carefully.  For the next 24 hours, you can use MyChart to ask questions about today's visit, request a non-urgent call back, or ask for a work or school excuse from your e-visit provider. You will get an e-mail in the next two days asking about your experience. I hope that your e-visit has been valuable and will speed your recovery.   

## 2018-04-16 ENCOUNTER — Other Ambulatory Visit: Payer: Self-pay | Admitting: Pharmacist

## 2018-04-16 MED ORDER — DUPILUMAB 300 MG/2ML ~~LOC~~ SOSY: 300.0000 mg | PREFILLED_SYRINGE | SUBCUTANEOUS | 3 refills | Status: DC

## 2018-04-16 MED ORDER — DUPILUMAB 300 MG/2ML ~~LOC~~ SOSY: 300.0000 mg | PREFILLED_SYRINGE | SUBCUTANEOUS | 5 refills | Status: DC

## 2018-04-16 MED FILL — DUPIXENT 300 MG/2 ML SAFE S: 300 | 28 days supply | Qty: 4 | Fill #0

## 2018-04-17 ENCOUNTER — Other Ambulatory Visit: Payer: Self-pay | Admitting: Pharmacist

## 2018-04-17 MED ORDER — DUPILUMAB 300 MG/2ML ~~LOC~~ SOSY: 300.0000 mg | PREFILLED_SYRINGE | SUBCUTANEOUS | 2 refills | Status: DC

## 2018-05-20 DIAGNOSIS — F419 Anxiety disorder, unspecified: Secondary | ICD-10-CM | POA: Diagnosis not present

## 2018-05-20 DIAGNOSIS — F40298 Other specified phobia: Secondary | ICD-10-CM | POA: Diagnosis not present

## 2018-05-22 MED FILL — DUPIXENT 300 MG/2 ML SAFE S: 300 | 28 days supply | Qty: 4 | Fill #0

## 2018-05-29 DIAGNOSIS — D2239 Melanocytic nevi of other parts of face: Secondary | ICD-10-CM | POA: Diagnosis not present

## 2018-05-29 DIAGNOSIS — L2089 Other atopic dermatitis: Secondary | ICD-10-CM | POA: Diagnosis not present

## 2018-05-29 DIAGNOSIS — Z79899 Other long term (current) drug therapy: Secondary | ICD-10-CM | POA: Diagnosis not present

## 2018-05-29 DIAGNOSIS — D485 Neoplasm of uncertain behavior of skin: Secondary | ICD-10-CM | POA: Diagnosis not present

## 2018-06-23 MED FILL — DUPIXENT 300 MG/2 ML SAFE S: 300 | 28 days supply | Qty: 4 | Fill #1

## 2018-07-10 ENCOUNTER — Telehealth: Payer: 59 | Admitting: Family

## 2018-07-10 DIAGNOSIS — B9689 Other specified bacterial agents as the cause of diseases classified elsewhere: Secondary | ICD-10-CM

## 2018-07-10 DIAGNOSIS — J208 Acute bronchitis due to other specified organisms: Secondary | ICD-10-CM | POA: Diagnosis not present

## 2018-07-10 MED ORDER — DOXYCYCLINE HYCLATE 100 MG PO TABS: 100.0000 mg | ORAL_TABLET | Freq: Two times a day (BID) | ORAL | 0 refills | Status: DC

## 2018-07-10 MED ORDER — PREDNISONE 5 MG PO TABS: 5.0000 mg | ORAL_TABLET | ORAL | 0 refills | Status: DC

## 2018-07-10 MED ORDER — ALBUTEROL SULFATE HFA 108 (90 BASE) MCG/ACT IN AERS: 2.0000 | INHALATION_SPRAY | Freq: Four times a day (QID) | RESPIRATORY_TRACT | 0 refills | Status: DC | PRN

## 2018-07-10 MED ORDER — BENZONATATE 100 MG PO CAPS: 100.0000 mg | ORAL_CAPSULE | Freq: Three times a day (TID) | ORAL | 0 refills | Status: DC | PRN

## 2018-07-10 NOTE — Progress Notes (Signed)
Thank you for the details you included in the comment boxes. Those details are very helpful in determining the best course of treatment for you and help [place] to provide the best care. Sorry to hear everyone is sick. See below.  We are sorry that you are not feeling well.  Here is how we plan to help!  Based on your presentation I believe you most likely have A cough due to bacteria.  When patients have a fever and a productive cough with a change in color or increased sputum production, we are concerned about bacterial bronchitis.  If left untreated it can progress to pneumonia.  If your symptoms do not improve with your treatment plan it is important that you contact your provider.   I have prescribed Doxycycline 100 mg twice a day for 7 days     In addition you may use A non-prescription cough medication called Mucinex DM: take 2 tablets every 12 hours. and A prescription cough medication called Tessalon Perles 100mg . You may take 1-2 capsules every 8 hours as needed for your cough.  Prednisone 5 mg daily for 6 days (see taper instructions below)  Directions for 6 day taper: Day 1: 2 tablets before breakfast, 1 after both lunch & dinner and 2 at bedtime Day 2: 1 tab before breakfast, 1 after both lunch & dinner and 2 at bedtime Day 3: 1 tab at each meal & 1 at bedtime Day 4: 1 tab at breakfast, 1 at lunch, 1 at bedtime Day 5: 1 tab at breakfast & 1 tab at bedtime Day 6: 1 tab at breakfast   From your responses in the eVisit questionnaire you describe inflammation in the upper respiratory tract which is causing a significant cough.  This is commonly called Bronchitis and has four common causes:    Allergies  Viral Infections  Acid Reflux  Bacterial Infection Allergies, viruses and acid reflux are treated by controlling symptoms or eliminating the cause. An example might be a cough caused by taking certain blood pressure medications. You stop the cough by changing the medication. Another  example might be a cough caused by acid reflux. Controlling the reflux helps control the cough.  USE OF BRONCHODILATOR ("RESCUE") INHALERS: There is a risk from using your bronchodilator too frequently.  The risk is that over-reliance on a medication which only relaxes the muscles surrounding the breathing tubes can reduce the effectiveness of medications prescribed to reduce swelling and congestion of the tubes themselves.  Although you feel brief relief from the bronchodilator inhaler, your asthma may actually be worsening with the tubes becoming more swollen and filled with mucus.  This can delay other crucial treatments, such as oral steroid medications. If you need to use a bronchodilator inhaler daily, several times per day, you should discuss this with your provider.  There are probably better treatments that could be used to keep your asthma under control.     HOME CARE . Only take medications as instructed by your medical team. . Complete the entire course of an antibiotic. . Drink plenty of fluids and get plenty of rest. . Avoid close contacts especially the very young and the elderly . Cover your mouth if you cough or cough into your sleeve. . Always remember to wash your hands . A steam or ultrasonic humidifier can help congestion.   GET HELP RIGHT AWAY IF: . You develop worsening fever. . You become short of breath . You cough up blood. . Your symptoms persist  after you have completed your treatment plan MAKE SURE YOU   Understand these instructions.  Will watch your condition.  Will get help right away if you are not doing well or get worse.  Your e-visit answers were reviewed by a board certified advanced clinical practitioner to complete your personal care plan.  Depending on the condition, your plan could have included both over the counter or prescription medications. If there is a problem please reply  once you have received a response from your provider. Your safety  is important to [place].  If you have drug allergies check your prescription carefully.    You can use MyChart to ask questions about today's visit, request a non-urgent call back, or ask for a work or school excuse for 24 hours related to this e-Visit. If it has been greater than 24 hours you will need to follow up with your provider, or enter a new e-Visit to address those concerns. You will get an e-mail in the next two days asking about your experience.  I hope that your e-visit has been valuable and will speed your recovery. Thank you for using e-visits.

## 2018-07-22 ENCOUNTER — Telehealth: Payer: Self-pay | Admitting: *Deleted

## 2018-07-22 NOTE — Telephone Encounter (Signed)
REFERRAL ONLY SENT TO SCHEDULING FROM DR. Red Jacket PERINI [phone].

## 2018-07-26 ENCOUNTER — Emergency Department (HOSPITAL_COMMUNITY)
Admission: EM | Admit: 2018-07-26 | Discharge: 2018-07-27 | Disposition: A | Discharge status: Home / Self Care | Payer: 59 | Attending: Emergency Medicine | Admitting: Emergency Medicine

## 2018-07-26 ENCOUNTER — Encounter (HOSPITAL_COMMUNITY): Payer: Self-pay

## 2018-07-26 ENCOUNTER — Other Ambulatory Visit: Payer: Self-pay

## 2018-07-26 DIAGNOSIS — Z79899 Other long term (current) drug therapy: Secondary | ICD-10-CM | POA: Insufficient documentation

## 2018-07-26 DIAGNOSIS — I493 Ventricular premature depolarization: Secondary | ICD-10-CM | POA: Insufficient documentation

## 2018-07-26 DIAGNOSIS — F17210 Nicotine dependence, cigarettes, uncomplicated: Secondary | ICD-10-CM | POA: Diagnosis not present

## 2018-07-26 DIAGNOSIS — R002 Palpitations: Secondary | ICD-10-CM | POA: Diagnosis not present

## 2018-07-26 LAB — BASIC METABOLIC PANEL
Anion gap: 8 (ref 5–15)
BUN: 10 mg/dL (ref 6–20)
CO2: 24 mmol/L (ref 22–32)
Calcium: 8.8 mg/dL — ABNORMAL LOW (ref 8.9–10.3)
Chloride: 104 mmol/L (ref 98–111)
Creatinine, Ser: 0.59 mg/dL (ref 0.44–1.00)
GFR calc Af Amer: >60 mL/min (ref >60)
Glucose, Bld: 98 mg/dL (ref 70–99)
POTASSIUM: 3.9 mmol/L (ref 3.5–5.1)
SODIUM: 136 mmol/L (ref 135–145)

## 2018-07-26 LAB — I-STAT BETA HCG BLOOD, ED (MC, WL, AP ONLY)

## 2018-07-26 LAB — CBC
HCT: 41.6 % (ref 36.0–46.0)
HEMOGLOBIN: 13.7 g/dL (ref 12.0–15.0)
MCH: 31.7 pg (ref 26.0–34.0)
MCHC: 32.9 g/dL (ref 30.0–36.0)
MCV: 96.3 fL (ref 80.0–100.0)
PLATELETS: 290 10*3/uL (ref 150–400)
RBC: 4.32 MIL/uL (ref 3.87–5.11)
RDW: 11.9 % (ref 11.5–15.5)
WBC: 7 10*3/uL (ref 4.0–10.5)
nRBC: 0 % (ref 0.0–0.2)

## 2018-07-26 LAB — I-STAT TROPONIN, ED: TROPONIN I, POC: 0.01 ng/mL (ref 0.00–0.08)

## 2018-07-26 LAB — PHOSPHORUS: Phosphorus: 4.7 mg/dL — ABNORMAL HIGH (ref 2.5–4.6)

## 2018-07-26 LAB — MAGNESIUM: Magnesium: 2 mg/dL (ref 1.7–2.4)

## 2018-07-26 NOTE — ED Triage Notes (Signed)
Pt presents for eval of palpitations, shoulder pain, nausea, and SOB x1 week. 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 States she has never been symptomatic before, but now has been experiencing increased SOB and nausea with each episode of PVC's.

## 2018-07-26 NOTE — ED Provider Notes (Signed)
[facility] EMERGENCY DEPARTMENT Provider Note   CSN: [id number] Arrival date & time: 07/26/18  2055     History   Chief Complaint Chief Complaint  Patient presents with  . Palpitations  . Shoulder Pain  . Nausea    HPI Ashley Hancock is a 38 y.o. female.  HPI For about the past month patient reports she has been getting PVCs.  She reports that over the past few days however this is increased in frequency a lot.  She reports when they occur it makes her feel short of breath and nauseated.  Is any chest pain in association with this.  She reports at one point she did feel lightheaded and slightly as if she might pass out.  There have been no syncopal episodes.  She reports she tried cutting down on caffeine but that did not seem to change his symptoms any.  Patient does smoke about 1/2 pack cigarettes per day with no other drug use.  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  She is actually scheduled to get a Holter monitor at the end of next week. Past Medical History:  Diagnosis Date  . Anxiety   . Blood transfusion without reported diagnosis    1unit post laparotomy 2015  . Dysrhythmia    irreg HR and PVCs  . Heart murmur   . History of gastric ulcer   . IBS (irritable bowel syndrome)   . Migraines     Patient Active Problem List   Diagnosis Date Noted  . Mass of breast, left 03/04/2018  . Pregnancy 05/02/2015  . General medical exam 09/09/2012  . Migraine 09/09/2012    Past Surgical History:  Procedure Laterality Date  . APPENDECTOMY    . AUGMENTATION MAMMAPLASTY Bilateral 2018  . CHOLECYSTECTOMY  2011  . HEMORRHOID SURGERY N/A 12/18/2015   Procedure:  I & D THROMBUS HEMORRHOID;  Surgeon: Jules Husbands, MD;  Location: [facility] ORS;  Service: General;  Laterality: N/A;  . LAPAROSCOPY    . LAPAROTOMY     x2  . RHINOPLASTY  2012  . WISDOM TOOTH EXTRACTION       OB History    Gravida  2   Para  2   Term    2   Preterm      AB      Living  2     SAB      TAB      Ectopic      Multiple  0   Live Births  2            Home Medications    Prior to Admission medications   Medication Sig Start Date End Date Taking? Authorizing Provider  butalbital-aspirin-caffeine [facility]) 50-325-40 MG capsule Take 1 capsule by mouth daily as needed for headache.   Yes [provider]  Dupilumab (DUPIXENT) 300 MG/2ML SOSY Inject 300 mg into the skin every 14 (fourteen) days. Inject 1 syringe every 2 weeks Patient taking differently: Inject 300 mg into the skin every 14 (fourteen) days.  04/17/18  Yes Tresa Garter, MD  FLUoxetine (PROZAC) 10 MG capsule Take 10 mg by mouth daily.   Yes [provider]  levonorgestrel (MIRENA) 20 MCG/24HR IUD 1 each by Intrauterine route once.   Yes [provider]  LORazepam (ATIVAN) 0.5 MG tablet Take 0.5 mg by mouth daily as needed for anxiety.   Yes [provider]  rizatriptan (MAXALT) 10 MG tablet Take 10 mg by mouth as needed for migraine. May repeat in 2 hours if needed   Yes [provider]  albuterol (PROVENTIL HFA;VENTOLIN HFA) 108 (90 Base) MCG/ACT inhaler Inhale 2 puffs into the lungs every 6 (six) hours as needed for wheezing or shortness of breath. Patient not taking: Reported on 07/26/2018 07/10/18   Withrow, Elyse Jarvis, FNP  benzonatate (TESSALON PERLES) 100 MG capsule Take 1-2 capsules (100-200 mg total) by mouth every 8 (eight) hours as needed for cough. Patient not taking: Reported on 07/26/2018 07/10/18   Withrow, Elyse Jarvis, FNP  doxycycline (VIBRA-TABS) 100 MG tablet Take 1 tablet (100 mg total) by mouth 2 (two) times daily. Patient not taking: Reported on 07/26/2018 07/10/18   Withrow, Elyse Jarvis, FNP  predniSONE (DELTASONE) 5 MG tablet Take 1 tablet (5 mg total) by mouth as directed. Taper 6,5,4,3,2,1 Patient not taking: Reported on 07/26/2018 07/10/18   Benjamine Mola, FNP    Family History Family History   Problem Relation Age of Onset  . Hypertension Mother   . Thyroid disease Mother   . Bipolar disorder Mother   . Heart disease Father        CHF;MI  . Diabetes Father   . Breast cancer Neg Hx     Social History Social History   Tobacco Use  . Smoking status: Current Every Day Smoker    Packs/day: 0.50    Years: 20.00    Pack years: 10.00    Types: Cigarettes  . Smokeless tobacco: Never Used  Substance Use Topics  . Alcohol use: No    Alcohol/week: 1.0 - 3.0 standard drinks    Types: 1 - 3 Glasses of wine per week  . Drug use: No     Allergies   Bacitracin   Review of Systems Review of Systems 10 Systems reviewed and are negative for acute change except as noted in the HPI.   Physical Exam Updated Vital Signs BP 123/84   Pulse 63   Resp 14   Ht 5\' 6"  (1.676 m)   Wt 56.7 kg   SpO2 96%   BMI 20.18 kg/m   Physical Exam Constitutional:      Appearance: She is well-developed.  HENT:     Head: Normocephalic and atraumatic.  Eyes:     Pupils: Pupils are equal, round, and reactive to light.  Neck:     Musculoskeletal: Neck supple.  Cardiovascular:     Rate and Rhythm: Normal rate and regular rhythm.     Heart sounds: Normal heart sounds.     Comments: Occasional monomorphic PVC on monitor. Pulmonary:     Effort: Pulmonary effort is normal.     Breath sounds: Normal breath sounds.  Abdominal:     General: Bowel sounds are normal. There is no distension.     Palpations: Abdomen is soft.     Tenderness: There is no abdominal tenderness.  Musculoskeletal: Normal range of motion.  Skin:    General: Skin is warm and dry.  Neurological:     Mental Status: She is alert and oriented to person, place, and time.     GCS: GCS eye subscore is 4. GCS verbal subscore is 5. GCS motor subscore is 6.     Coordination: Coordination normal.  Psychiatric:        Mood and Affect: Mood normal.      ED Treatments / Results  Labs (all labs ordered are listed, but  only  abnormal results are displayed) Labs Reviewed  BASIC METABOLIC PANEL - Abnormal; Notable for the following components:      Result Value   Calcium 8.8 (*)    All other components within normal limits  PHOSPHORUS - Abnormal; Notable for the following components:   Phosphorus 4.7 (*)    All other components within normal limits  CBC  MAGNESIUM  TSH  I-STAT BETA HCG BLOOD, ED ([facility], [facility], [facility] ONLY)  I-STAT TROPONIN, ED    EKG EKG Interpretation  Date/Time:  Sunday July 26 2018 21:05:52 EST Ventricular Rate:  72 PR Interval:    QRS Duration: 98 QT Interval:  385 QTC Calculation: 422 R Axis:   96 Text Interpretation:  Sinus rhythm Ventricular premature complex Borderline right axis deviation one pvc otherwise no change from previous Confirmed by Charlesetta Shanks [phone]) on 07/26/2018 10:22:59 PM   Radiology No results found.  Procedures Procedures (including critical care time)  Medications Ordered in ED Medications - No data to display   Initial Impression / Assessment and Plan / ED Course  I have reviewed the triage vital signs and the nursing notes.  Pertinent labs & imaging results that were available during my care of the patient were reviewed by me and considered in my medical decision making (see chart for details).    Patient is clinically well in appearance.  Diagnostic evaluation within normal limits.  Occasional monomorphic PVC.  This is had increased frequency at home.  At times patient feels lightheaded.  She has not had any syncopal episodes.  Patient appears low risk for cardiac ischemic etiology.  She is established to get a Holter monitor by the end of next week.  Discussed resumption of propranolol empirically but she would prefer to wait until her Holter monitoring has been done.  Return precautions reviewed.  Final Clinical Impressions(s) / ED Diagnoses   Final diagnoses:  Palpitations  Symptomatic PVCs    ED Discharge Orders    None        Charlesetta Shanks, MD 07/27/18 [id number]

## 2018-07-27 ENCOUNTER — Ambulatory Visit (INDEPENDENT_AMBULATORY_CARE_PROVIDER_SITE_OTHER): Payer: 59

## 2018-07-27 ENCOUNTER — Other Ambulatory Visit: Payer: Self-pay | Admitting: Internal Medicine

## 2018-07-27 DIAGNOSIS — R002 Palpitations: Secondary | ICD-10-CM

## 2018-07-27 NOTE — ED Notes (Signed)
Patient verbalizes understanding of discharge instructions. Opportunity for questioning and answers were provided. Armband removed by staff, pt discharged from ED ambulatory.   

## 2018-07-30 DIAGNOSIS — G43909 Migraine, unspecified, not intractable, without status migrainosus: Secondary | ICD-10-CM | POA: Diagnosis not present

## 2018-07-30 DIAGNOSIS — I493 Ventricular premature depolarization: Secondary | ICD-10-CM | POA: Diagnosis not present

## 2018-07-31 ENCOUNTER — Other Ambulatory Visit
Admission: RE | Admit: 2018-07-31 | Discharge: 2018-07-31 | Disposition: A | Discharge status: Home / Self Care | Payer: 59 | Source: Ambulatory Visit | Attending: Internal Medicine | Admitting: Internal Medicine

## 2018-07-31 DIAGNOSIS — N6310 Unspecified lump in the right breast, unspecified quadrant: Secondary | ICD-10-CM | POA: Insufficient documentation

## 2018-07-31 LAB — TSH: TSH: 0.622 u[IU]/mL (ref 0.350–4.500)

## 2018-07-31 LAB — T4, FREE: Free T4: 0.98 ng/dL (ref 0.82–1.77)

## 2018-08-05 ENCOUNTER — Other Ambulatory Visit: Payer: Self-pay | Admitting: Surgery

## 2018-08-05 DIAGNOSIS — N6310 Unspecified lump in the right breast, unspecified quadrant: Secondary | ICD-10-CM

## 2018-08-11 ENCOUNTER — Other Ambulatory Visit: Payer: Self-pay | Admitting: Cardiology

## 2018-08-11 ENCOUNTER — Ambulatory Visit: Payer: 59 | Admitting: Cardiology

## 2018-08-11 ENCOUNTER — Encounter: Payer: Self-pay | Admitting: Cardiology

## 2018-08-11 VITALS — BP 110/76 | HR 74 | Ht 66.0 in | Wt 130.8 lb

## 2018-08-11 DIAGNOSIS — Z8249 Family history of ischemic heart disease and other diseases of the circulatory system: Secondary | ICD-10-CM

## 2018-08-11 DIAGNOSIS — Z9189 Other specified personal risk factors, not elsewhere classified: Secondary | ICD-10-CM

## 2018-08-11 DIAGNOSIS — I493 Ventricular premature depolarization: Secondary | ICD-10-CM

## 2018-08-11 DIAGNOSIS — Z716 Tobacco abuse counseling: Secondary | ICD-10-CM

## 2018-08-11 NOTE — Progress Notes (Signed)
Subjective:   Patient ID: Ashley Hancock, female    DOB: 1981/03/22, 38 y.o.   MRN: [id number]  Patient referred by Crist Infante for PVCs.  Chief Complaint  Patient presents with  . New Patient (Initial Visit)    For PVC's    HPI Patient reports she has had palpitations since her early 81s, but they have become more frequent in the past month.  She says she can feel palpitations almost constantly now.  She used a Holter monitor for a few weeks and had episodes of tachycardia with a rate up to 130 bpm, although her baseline HR was around 70.  She has had nausea and feelings of lightheadedness that accompanied her palpitations, but these have improved since starting Inderal 40 mg BID, which she started about two weeks ago.  She sometimes has chest pain that is positional and does not worsen with activity.  TSH and T4 were measured and were normal.  She has tried eliminating caffeine from her diet and has stopped taking Fioricet, but these have not resulted in improvement.  She has also tried to take Ativan when she has an episode and does not notice improvement.  She has reduced her cigarette smoking from almost 1 ppd to 0.5 ppd.  She drinks about two alcoholic beverages daily.    She is not interested in tobacco cessation at this time.  Past Medical History:  Diagnosis Date  . Anxiety   . Blood transfusion without reported diagnosis    1unit post laparotomy 2015  . Dysrhythmia    irreg HR and PVCs  . Heart murmur   . History of gastric ulcer   . IBS (irritable bowel syndrome)   . Migraines     Past Surgical History:  Procedure Laterality Date  . APPENDECTOMY    . AUGMENTATION MAMMAPLASTY Bilateral 2018  . CHOLECYSTECTOMY  2011  . HEMORRHOID SURGERY N/A 12/18/2015   Procedure:  I & D THROMBUS HEMORRHOID;  Surgeon: Jules Husbands, MD;  Location: [facility] ORS;  Service: General;  Laterality: N/A;  . LAPAROSCOPY    . LAPAROTOMY     x2  . RHINOPLASTY  2012  . WISDOM TOOTH  EXTRACTION      Family History  Problem Relation Age of Onset  . Hypertension Mother   . Thyroid disease Mother   . Bipolar disorder Mother   . Heart disease Father        CHF;MI  . Diabetes Father   . Breast cancer Neg Hx     Social History   Socioeconomic History  . Marital status: Married    Spouse name: Not on file  . Number of children: 1  . Years of education: 68  . Highest education level: Not on file  Occupational History  . Not on file  Social Needs  . Financial resource strain: Not on file  . Food insecurity:    Worry: Not on file    Inability: Not on file  . Transportation needs:    Medical: Not on file    Non-medical: Not on file  Tobacco Use  . Smoking status: Current Every Day Smoker    Packs/day: 0.50    Years: 20.00    Pack years: 10.00    Types: Cigarettes  . Smokeless tobacco: Never Used  Substance and Sexual Activity  . Alcohol use: No    Alcohol/week: 1.0 - 3.0 standard drinks    Types: 1 - 3 Glasses of wine per  week  . Drug use: No  . Sexual activity: Yes    Partners: Male    Birth control/protection: None  Lifestyle  . Physical activity:    Days per week: Not on file    Minutes per session: Not on file  . Stress: Not on file  Relationships  . Social connections:    Talks on phone: Not on file    Gets together: Not on file    Attends religious service: Not on file    Active member of club or organization: Not on file    Attends meetings of clubs or organizations: Not on file    Relationship status: Not on file  . Intimate partner violence:    Fear of current or ex partner: Not on file    Emotionally abused: Not on file    Physically abused: Not on file    Forced sexual activity: Not on file  Other Topics Concern  . Not on file  Social History Narrative  . Not on file    Current Outpatient Medications on File Prior to Visit  Medication Sig Dispense Refill  . albuterol (PROVENTIL HFA;VENTOLIN HFA) 108 (90 Base) MCG/ACT  inhaler Inhale 2 puffs into the lungs every 6 (six) hours as needed for wheezing or shortness of breath. 1 Inhaler 0  . butalbital-aspirin-caffeine (FIORINAL) 50-325-40 MG capsule Take 1 capsule by mouth daily as needed for headache.    . Dupilumab (DUPIXENT) 300 MG/2ML SOSY Inject 300 mg into the skin every 14 (fourteen) days. Inject 1 syringe every 2 weeks (Patient taking differently: Inject 300 mg into the skin every 14 (fourteen) days. ) 2 Syringe 2  . FLUoxetine (PROZAC) 10 MG capsule Take 10 mg by mouth daily.    Marland Kitchen levonorgestrel (MIRENA) 20 MCG/24HR IUD 1 each by Intrauterine route once.    Marland Kitchen LORazepam (ATIVAN) 0.5 MG tablet Take 0.5 mg by mouth daily as needed for anxiety.    . propranolol (INDERAL) 40 MG tablet Take 40 mg by mouth 2 (two) times daily.    . rizatriptan (MAXALT) 10 MG tablet Take 10 mg by mouth as needed for migraine. May repeat in 2 hours if needed     No current facility-administered medications on file prior to visit.     Review of Systems  Constitution: Negative for decreased appetite and weight loss.  Cardiovascular: Positive for chest pain, near-syncope and palpitations. Negative for dyspnea on exertion, orthopnea and syncope.  Respiratory: Negative for cough and wheezing.   Endocrine: Positive for heat intolerance.  Gastrointestinal: Positive for nausea. Negative for abdominal pain.  Psychiatric/Behavioral: The patient is nervous/anxious.        Objective:   Physical Exam  Constitutional: She appears well-developed and well-nourished. No distress.  HENT:  Head: Normocephalic and atraumatic.  Eyes: Conjunctivae and EOM are normal.  Neck: Normal range of motion.  Cardiovascular: Normal rate, regular rhythm and intact distal pulses.  Pulmonary/Chest: Effort normal and breath sounds normal. She has no wheezes. She has no rales.  Abdominal: She exhibits no distension.  Musculoskeletal: Normal range of motion.  Neurological: She is alert.  Skin: Skin is warm  and dry.  Psychiatric: She has a normal mood and affect. Her behavior is normal.     Today's Vitals   08/11/18 1343  BP: 110/76  Pulse: 74  SpO2: 99%  Weight: 130 lb 12.8 oz (59.3 kg)  Height: 5\' 6"  (1.676 m)   Body mass index is 21.11 kg/m.  Assessment & Recommendations:   PVC (premature ventricular contraction) - Plan: EKG 12-Lead, PCV ECHOCARDIOGRAM COMPLETE,  - Holter monitor was reviewed and was reassuring, with HR ranging from 57-130 and PVC load of 1%.  Will obtain echocardiogram to exclde structural abnormalities. Continue propranolol for now.   Family history of premature CAD: Given strong family history of premature CAD and patients smoking history, recommend calcium scoring for cardiac risk stratification and assess the need for statin. Will also obtain lipid panel records from PCP office.   Tobacco abuse counseling: Patient has tried Chantix, Welbutrin, tobacco patch, and gum before. Currently not using any of the above. She is at pre-contemplation stage. Will follow up/  I will see her back in 3 months..   Thank you for referring this patient. Please do not hesitate to contact me for any questions.   Pauline Pegues C. Shan Levans, MD PGY-2, [facility] 08/11/2018 3:15 PM   Patient seen with Maia Breslow, MD. I independently reviewed the chart and examined the patient. I agree with the plan and assessment, as listed above.  Nigel Mormon, MD [facility]. [place] Pager: [phone] Office: [phone] If no answer Cell [phone]

## 2018-08-11 NOTE — Progress Notes (Deleted)
Subjective:   @Patient  ID@: Ashley Hancock, female    DOB: 28-Jan-1981, 38 y.o.   MRN: [id number]  Patient referred by Crist Infante for ***  No chief complaint on file.   HPI  Past Medical History:  Diagnosis Date  . Anxiety   . Blood transfusion without reported diagnosis    1unit post laparotomy 2015  . Dysrhythmia    irreg HR and PVCs  . Heart murmur   . History of gastric ulcer   . IBS (irritable bowel syndrome)   . Migraines     Past Surgical History:  Procedure Laterality Date  . APPENDECTOMY    . AUGMENTATION MAMMAPLASTY Bilateral 2018  . CHOLECYSTECTOMY  2011  . HEMORRHOID SURGERY N/A 12/18/2015   Procedure:  I & D THROMBUS HEMORRHOID;  Surgeon: Jules Husbands, MD;  Location: [facility] ORS;  Service: General;  Laterality: N/A;  . LAPAROSCOPY    . LAPAROTOMY     x2  . RHINOPLASTY  2012  . WISDOM TOOTH EXTRACTION      Family History  Problem Relation Age of Onset  . Hypertension Mother   . Thyroid disease Mother   . Bipolar disorder Mother   . Heart disease Father        CHF;MI  . Diabetes Father   . Breast cancer Neg Hx     Social History   Socioeconomic History  . Marital status: Married    Spouse name: Not on file  . Number of children: 1  . Years of education: 23  . Highest education level: Not on file  Occupational History  . Not on file  Social Needs  . Financial resource strain: Not on file  . Food insecurity:    Worry: Not on file    Inability: Not on file  . Transportation needs:    Medical: Not on file    Non-medical: Not on file  Tobacco Use  . Smoking status: Current Every Day Smoker    Packs/day: 0.50    Years: 20.00    Pack years: 10.00    Types: Cigarettes  . Smokeless tobacco: Never Used  Substance and Sexual Activity  . Alcohol use: No    Alcohol/week: 1.0 - 3.0 standard drinks    Types: 1 - 3 Glasses of wine per week  . Drug use: No  . Sexual activity: Yes    Partners: Male    Birth control/protection: None    Lifestyle  . Physical activity:    Days per week: Not on file    Minutes per session: Not on file  . Stress: Not on file  Relationships  . Social connections:    Talks on phone: Not on file    Gets together: Not on file    Attends religious service: Not on file    Active member of club or organization: Not on file    Attends meetings of clubs or organizations: Not on file    Relationship status: Not on file  . Intimate partner violence:    Fear of current or ex partner: Not on file    Emotionally abused: Not on file    Physically abused: Not on file    Forced sexual activity: Not on file  Other Topics Concern  . Not on file  Social History Narrative  . Not on file    Current Outpatient Medications on File Prior to Visit  Medication Sig Dispense Refill  . albuterol (PROVENTIL HFA;VENTOLIN HFA) 108 (  90 Base) MCG/ACT inhaler Inhale 2 puffs into the lungs every 6 (six) hours as needed for wheezing or shortness of breath. (Patient not taking: Reported on 07/26/2018) 1 Inhaler 0  . benzonatate (TESSALON PERLES) 100 MG capsule Take 1-2 capsules (100-200 mg total) by mouth every 8 (eight) hours as needed for cough. (Patient not taking: Reported on 07/26/2018) 30 capsule 0  . butalbital-aspirin-caffeine (FIORINAL) 50-325-40 MG capsule Take 1 capsule by mouth daily as needed for headache.    . doxycycline (VIBRA-TABS) 100 MG tablet Take 1 tablet (100 mg total) by mouth 2 (two) times daily. (Patient not taking: Reported on 07/26/2018) 14 tablet 0  . Dupilumab (DUPIXENT) 300 MG/2ML SOSY Inject 300 mg into the skin every 14 (fourteen) days. Inject 1 syringe every 2 weeks (Patient taking differently: Inject 300 mg into the skin every 14 (fourteen) days. ) 2 Syringe 2  . FLUoxetine (PROZAC) 10 MG capsule Take 10 mg by mouth daily.    Marland Kitchen levonorgestrel (MIRENA) 20 MCG/24HR IUD 1 each by Intrauterine route once.    Marland Kitchen LORazepam (ATIVAN) 0.5 MG tablet Take 0.5 mg by mouth daily as needed for anxiety.     . predniSONE (DELTASONE) 5 MG tablet Take 1 tablet (5 mg total) by mouth as directed. Taper 6,5,4,3,2,1 (Patient not taking: Reported on 07/26/2018) 21 tablet 0  . rizatriptan (MAXALT) 10 MG tablet Take 10 mg by mouth as needed for migraine. May repeat in 2 hours if needed     No current facility-administered medications on file prior to visit.     ***Echocardiogram ***  ***Stress test***    ROS     Objective:   Physical Exam  Constitutional: She appears well-developed and well-nourished.  HENT:  Head: Normocephalic and atraumatic.  Vitals reviewed.        Assessment & Recommendations:   There are no diagnoses linked to this encounter.   Thank you for referring this patient. Please do not hesitate to contact me for any questions.   Jeri Lager, FNP-C [facility], [place] Office: [phone] Fax: [phone]

## 2018-08-19 DIAGNOSIS — H906 Mixed conductive and sensorineural hearing loss, bilateral: Secondary | ICD-10-CM | POA: Diagnosis not present

## 2018-08-19 DIAGNOSIS — H65113 Acute and subacute allergic otitis media (mucoid) (sanguinous) (serous), bilateral: Secondary | ICD-10-CM | POA: Diagnosis not present

## 2018-09-04 ENCOUNTER — Ambulatory Visit: Payer: 59

## 2018-09-04 DIAGNOSIS — I493 Ventricular premature depolarization: Secondary | ICD-10-CM

## 2018-09-08 ENCOUNTER — Telehealth: Payer: Self-pay

## 2018-09-08 NOTE — Telephone Encounter (Signed)
Pt called regarding her CT scan for the cornary artery calcium score. Per MP score was Zero, this was done at [facility] . Advised pt of results

## 2018-09-23 ENCOUNTER — Telehealth: Payer: 59 | Admitting: Family

## 2018-09-23 DIAGNOSIS — J329 Chronic sinusitis, unspecified: Secondary | ICD-10-CM

## 2018-09-23 DIAGNOSIS — B9789 Other viral agents as the cause of diseases classified elsewhere: Secondary | ICD-10-CM | POA: Diagnosis not present

## 2018-09-23 MED ORDER — FLUTICASONE PROPIONATE 50 MCG/ACT NA SUSP: 2.0000 | Freq: Every day | NASAL | 6 refills | Status: DC

## 2018-09-23 NOTE — Progress Notes (Signed)
We are sorry that you are not feeling well.  Here is how we plan to help!  Based on what you have shared with me it looks like you have sinusitis.  Sinusitis is inflammation and infection in the sinus cavities of the head.  Based on your presentation I believe you most likely have Acute Viral Sinusitis.This is an infection most likely caused by a virus. There is not specific treatment for viral sinusitis other than to help you with the symptoms until the infection runs its course.  You may use an oral decongestant such as Mucinex D or if you have glaucoma or high blood pressure use plain Mucinex. Saline nasal spray help and can safely be used as often as needed for congestion, I have prescribed: Fluticasone nasal spray two sprays in each nostril once a day.   Approximately 5 minutes was spent documenting and reviewing patient's chart.   Providers prescribe antibiotics to treat infections caused by bacteria. Antibiotics are very powerful in treating bacterial infections when they are used properly. To maintain their effectiveness, they should be used only when necessary. Overuse of antibiotics has resulted in the development of superbugs that are resistant to treatment!    After careful review of your answers, I would not recommend an antibiotic for your condition.  Antibiotics are not effective against viruses and therefore should not be used to treat them. Common examples of infections caused by viruses include colds and flu    Some authorities believe that zinc sprays or the use of Echinacea may shorten the course of your symptoms.  Sinus infections are not as easily transmitted as other respiratory infection, however we still recommend that you avoid close contact with loved ones, especially the very young and elderly.  Remember to wash your hands thoroughly throughout the day as this is the number one way to prevent the spread of infection!  Home Care:  Only take medications as instructed by  your medical team.  Do not take these medications with alcohol.  A steam or ultrasonic humidifier can help congestion.  You can place a towel over your head and breathe in the steam from hot water coming from a faucet.  Avoid close contacts especially the very young and the elderly.  Cover your mouth when you cough or sneeze.  Always remember to wash your hands.  Get Help Right Away If:  You develop worsening fever or sinus pain.  You develop a severe head ache or visual changes.  Your symptoms persist after you have completed your treatment plan.  Make sure you  Understand these instructions.  Will watch your condition.  Will get help right away if you are not doing well or get worse.  Your e-visit answers were reviewed by a board certified advanced clinical practitioner to complete your personal care plan.  Depending on the condition, your plan could have included both over the counter or prescription medications.  If there is a problem please reply  once you have received a response from your provider.  Your safety is important to [place].  If you have drug allergies check your prescription carefully.    You can use MyChart to ask questions about today's visit, request a non-urgent call back, or ask for a work or school excuse for 24 hours related to this e-Visit. If it has been greater than 24 hours you will need to follow up with your provider, or enter a new e-Visit to address those concerns.  You will get an  e-mail in the next two days asking about your experience.  I hope that your e-visit has been valuable and will speed your recovery. Thank you for using e-visits.

## 2018-09-28 DIAGNOSIS — R05 Cough: Secondary | ICD-10-CM | POA: Diagnosis not present

## 2018-09-28 DIAGNOSIS — J069 Acute upper respiratory infection, unspecified: Secondary | ICD-10-CM | POA: Diagnosis not present

## 2018-09-28 DIAGNOSIS — J40 Bronchitis, not specified as acute or chronic: Secondary | ICD-10-CM | POA: Diagnosis not present

## 2018-10-20 ENCOUNTER — Telehealth: Payer: Self-pay | Admitting: Pharmacist

## 2018-10-20 NOTE — Telephone Encounter (Signed)
Called patient to schedule an appointment for the [employer] [facility] [employer] [facility]. I was unable to reach the patient so I left a HIPAA-compliant message requesting that the patient return my call.   

## 2018-11-12 ENCOUNTER — Ambulatory Visit: Payer: 59 | Admitting: Cardiology

## 2018-11-19 ENCOUNTER — Other Ambulatory Visit: Payer: Self-pay

## 2018-11-19 ENCOUNTER — Ambulatory Visit (INDEPENDENT_AMBULATORY_CARE_PROVIDER_SITE_OTHER): Payer: 59 | Admitting: Pharmacist

## 2018-11-19 DIAGNOSIS — Z79899 Other long term (current) drug therapy: Secondary | ICD-10-CM

## 2018-11-19 NOTE — Progress Notes (Signed)
   S: Patient presents for review of their specialty medication therapy.  Patient is currently taking Dupixent for atopic dermatitis. Patient is managed by Dr. Fontaine No for this.   Adherence: denies any missed doses  Efficacy: reports that it is working well and denies any adverse effects  Dosing: 300 mg every 2 weeks  Drug-drug interactions: none  Monitoring: Local injection site reactions: denies Hypersensitivity: denies Eosinophilia: denies Ocular effects: denies S/sx of parasitic infections: denies  O:     Lab Results  Component Value Date   WBC 7.0 07/26/2018   HGB 13.7 07/26/2018   HCT 41.6 07/26/2018   MCV 96.3 07/26/2018   PLT 290 07/26/2018      Chemistry      Component Value Date/Time   NA 136 07/26/2018 2246   NA 139 03/01/2014 1131   K 3.9 07/26/2018 2246   K 4.0 03/01/2014 1131   CL 104 07/26/2018 2246   CL 108 (H) 03/01/2014 1131   CO2 24 07/26/2018 2246   CO2 23 03/01/2014 1131   BUN 10 07/26/2018 2246   BUN 5 (L) 03/01/2014 1131   CREATININE 0.59 07/26/2018 2246   CREATININE 0.60 03/01/2014 1131      Component Value Date/Time   CALCIUM 8.8 (L) 07/26/2018 2246   CALCIUM 9.0 03/01/2014 1131   ALKPHOS 45 03/06/2017 0805   ALKPHOS 81 05/22/2013 2040   AST 16 03/06/2017 0805   AST 40 (H) 05/22/2013 2040   ALT 12 (L) 03/06/2017 0805   ALT 84 (H) 05/22/2013 2040   BILITOT 0.6 03/06/2017 0805   BILITOT 0.3 05/22/2013 2040       A/P: 1. Medication review: Patient currently on [place] for atopic dermatitis and is tolerating it well with no adverse effects. Reviewed the medication, including the following: Dupixent is a monoclonal antibody used to decrease inflammation. Possible adverse effects include injection site reactions, ocular adverse effects, eosinophilia, vasculitis, and increased risk of parasitic infections. No recommendations for any changes.    Christella Hartigan, PharmD, BCPS, BCACP, CPP Clinical Pharmacist Practitioner   [phone]

## 2019-03-28 ENCOUNTER — Encounter (HOSPITAL_COMMUNITY): Payer: Self-pay | Admitting: *Deleted

## 2019-03-28 ENCOUNTER — Emergency Department (HOSPITAL_COMMUNITY)
Admission: EM | Admit: 2019-03-28 | Discharge: 2019-03-29 | Disposition: A | Discharge status: Home / Self Care | Payer: 59 | Attending: Emergency Medicine | Admitting: Emergency Medicine

## 2019-03-28 ENCOUNTER — Other Ambulatory Visit: Payer: Self-pay

## 2019-03-28 DIAGNOSIS — R1012 Left upper quadrant pain: Secondary | ICD-10-CM | POA: Diagnosis present

## 2019-03-28 DIAGNOSIS — K529 Noninfective gastroenteritis and colitis, unspecified: Secondary | ICD-10-CM | POA: Diagnosis not present

## 2019-03-28 DIAGNOSIS — F17210 Nicotine dependence, cigarettes, uncomplicated: Secondary | ICD-10-CM | POA: Insufficient documentation

## 2019-03-28 DIAGNOSIS — Z79899 Other long term (current) drug therapy: Secondary | ICD-10-CM | POA: Insufficient documentation

## 2019-03-28 LAB — CBC
HCT: 41.9 % (ref 36.0–46.0)
Hemoglobin: 14.4 g/dL (ref 12.0–15.0)
MCH: 32.6 pg (ref 26.0–34.0)
MCHC: 34.4 g/dL (ref 30.0–36.0)
MCV: 94.8 fL (ref 80.0–100.0)
Platelets: 292 10*3/uL (ref 150–400)
RBC: 4.42 MIL/uL (ref 3.87–5.11)
RDW: 11.8 % (ref 11.5–15.5)
WBC: 7.3 10*3/uL (ref 4.0–10.5)
nRBC: 0 % (ref 0.0–0.2)

## 2019-03-28 LAB — COMPREHENSIVE METABOLIC PANEL
ALT: 15 U/L (ref 0–44)
AST: 18 U/L (ref 15–41)
Albumin: 4 g/dL (ref 3.5–5.0)
Alkaline Phosphatase: 39 U/L (ref 38–126)
Anion gap: 9 (ref 5–15)
BUN: 9 mg/dL (ref 6–20)
CO2: 21 mmol/L — ABNORMAL LOW (ref 22–32)
Calcium: 9 mg/dL (ref 8.9–10.3)
Chloride: 103 mmol/L (ref 98–111)
Creatinine, Ser: 0.62 mg/dL (ref 0.44–1.00)
GFR calc Af Amer: >60 mL/min (ref >60)
GFR calc non Af Amer: >60 mL/min (ref >60)
Glucose, Bld: 88 mg/dL (ref 70–99)
Potassium: 3.4 mmol/L — ABNORMAL LOW (ref 3.5–5.1)
Sodium: 133 mmol/L — ABNORMAL LOW (ref 135–145)
Total Bilirubin: 0.4 mg/dL (ref 0.3–1.2)
Total Protein: 6.5 g/dL (ref 6.5–8.1)

## 2019-03-28 LAB — I-STAT BETA HCG BLOOD, ED (MC, WL, AP ONLY): I-stat hCG, quantitative: <5 m[IU]/mL (ref <5)

## 2019-03-28 LAB — URINALYSIS, ROUTINE W REFLEX MICROSCOPIC
Bilirubin Urine: NEGATIVE
Glucose, UA: NEGATIVE mg/dL
Hgb urine dipstick: NEGATIVE
Ketones, ur: NEGATIVE mg/dL
Leukocytes,Ua: NEGATIVE
Nitrite: NEGATIVE
Protein, ur: NEGATIVE mg/dL
Specific Gravity, Urine: 1.001 — ABNORMAL LOW (ref 1.005–1.030)
pH: 7 (ref 5.0–8.0)

## 2019-03-28 LAB — LIPASE, BLOOD: Lipase: 24 U/L (ref 11–51)

## 2019-03-28 MED ORDER — ONDANSETRON HCL 4 MG/2ML IJ SOLN
4.0000 mg | Freq: Once | INTRAMUSCULAR | Status: AC
  Administered 2019-03-28: 4 mg via INTRAVENOUS
  Filled 2019-03-28: qty 2

## 2019-03-28 MED ORDER — SODIUM CHLORIDE 0.9 % IV BOLUS
1000.0000 mL | Freq: Once | INTRAVENOUS | Status: AC
  Administered 2019-03-28: 1000 mL via INTRAVENOUS

## 2019-03-28 MED ORDER — SODIUM CHLORIDE 0.9% FLUSH: 3.0000 mL | Freq: Once | INTRAVENOUS | Status: DC

## 2019-03-28 MED ORDER — HYDROMORPHONE HCL 1 MG/ML IJ SOLN
1.0000 mg | Freq: Once | INTRAMUSCULAR | Status: AC
  Administered 2019-03-28: 1 mg via INTRAVENOUS
  Filled 2019-03-28: qty 1

## 2019-03-28 NOTE — ED Provider Notes (Signed)
[facility] EMERGENCY DEPARTMENT Provider Note   CSN: [id number] Arrival date & time: 03/28/19  2230     History   Chief Complaint Chief Complaint  Patient presents with  . Abdominal Pain    HPI Ashley Hancock is a 38 y.o. female.     Complains of severe, worsening left upper abdominal pain for two days. Has had nausea, no vomiting or hematemesis. Denies rectal bleeding and melena. Pain worse tonight, radiates to back and chest now. Does have a history of PUD. Reports increased NSAID use for the last week or two due to headaches. Admits to daily alcohol use, no h/o pancreatitis.     Past Medical History:  Diagnosis Date  . Anxiety   . Blood transfusion without reported diagnosis    1unit post laparotomy 2015  . Dysrhythmia    irreg HR and PVCs  . Heart murmur   . History of gastric ulcer   . IBS (irritable bowel syndrome)   . Migraines     Patient Active Problem List   Diagnosis Date Noted  . Mass of breast, left 03/04/2018  . Pregnancy 05/02/2015  . General medical exam 09/09/2012  . Migraine 09/09/2012    Past Surgical History:  Procedure Laterality Date  . APPENDECTOMY    . AUGMENTATION MAMMAPLASTY Bilateral 2018  . CHOLECYSTECTOMY  2011  . HEMORRHOID SURGERY N/A 12/18/2015   Procedure:  I & D THROMBUS HEMORRHOID;  Surgeon: Jules Husbands, MD;  Location: [facility] ORS;  Service: General;  Laterality: N/A;  . LAPAROSCOPY    . LAPAROTOMY     x2  . RHINOPLASTY  2012  . WISDOM TOOTH EXTRACTION       OB History    Gravida  2   Para  2   Term  2   Preterm      AB      Living  2     SAB      TAB      Ectopic      Multiple  0   Live Births  2            Home Medications    Prior to Admission medications   Medication Sig Start Date End Date Taking? Authorizing Provider  albuterol (PROVENTIL HFA;VENTOLIN HFA) 108 (90 Base) MCG/ACT inhaler Inhale 2 puffs into the lungs every 6 (six) hours as needed for wheezing or  shortness of breath. 07/10/18  Yes Withrow, Elyse Jarvis, FNP  butalbital-acetaminophen-caffeine (FIORICET) 50-325-40 MG tablet Take 1 tablet by mouth every 4 (four) hours as needed for headache.   Yes [provider]  cyclobenzaprine (FLEXERIL) 5 MG tablet Take 5 mg by mouth 3 (three) times daily as needed for muscle spasms.   Yes [provider]  Dupilumab (DUPIXENT) 300 MG/2ML SOSY Inject 300 mg into the skin every 14 (fourteen) days. Inject 1 syringe every 2 weeks Patient taking differently: Inject 300 mg into the skin every 14 (fourteen) days.  04/17/18  Yes Tresa Garter, MD  famotidine (PEPCID) 20 MG tablet Take 20 mg by mouth 2 (two) times daily as needed for heartburn or indigestion.   Yes [provider]  FLUoxetine (PROZAC) 20 MG tablet Take 20 mg by mouth daily.   Yes [provider]  ibuprofen (ADVIL) 200 MG tablet Take 200 mg by mouth every 6 (six) hours as needed for headache.   Yes [provider]  levonorgestrel (MIRENA) 20 MCG/24HR IUD 1 each by Intrauterine  route once.   Yes [provider]  LORazepam (ATIVAN) 0.5 MG tablet Take 0.5 mg by mouth daily as needed for anxiety.   Yes [provider]  rizatriptan (MAXALT) 10 MG tablet Take 10 mg by mouth as needed for migraine. May repeat in 2 hours if needed   Yes [provider]  ciprofloxacin (CIPRO) 500 MG tablet Take 1 tablet (500 mg total) by mouth 2 (two) times daily. One po bid x 7 days 03/29/19   Orpah Greek, MD  dicyclomine (BENTYL) 20 MG tablet Take 1 tablet (20 mg total) by mouth 2 (two) times daily. 03/29/19   Orpah Greek, MD  fluticasone (FLONASE) 50 MCG/ACT nasal spray Place 2 sprays into both nostrils daily. Patient not taking: Reported on 03/29/2019 09/23/18   Sharion Balloon, FNP  metroNIDAZOLE (FLAGYL) 500 MG tablet Take 1 tablet (500 mg total) by mouth 3 (three) times daily. 03/29/19   Orpah Greek, MD  ondansetron  (ZOFRAN) 4 MG tablet Take 1 tablet (4 mg total) by mouth every 6 (six) hours as needed for nausea or vomiting. 03/29/19   Pollina, Gwenyth Allegra, MD    Family History Family History  Problem Relation Age of Onset  . Hypertension Mother   . Thyroid disease Mother   . Bipolar disorder Mother   . Heart disease Father        CHF;MI  . Diabetes Father   . Breast cancer Neg Hx     Social History Social History   Tobacco Use  . Smoking status: Current Every Day Smoker    Packs/day: 0.50    Years: 20.00    Pack years: 10.00    Types: Cigarettes  . Smokeless tobacco: Never Used  Substance Use Topics  . Alcohol use: No    Alcohol/week: 1.0 - 3.0 standard drinks    Types: 1 - 3 Glasses of wine per week  . Drug use: No     Allergies   Bacitracin   Review of Systems Review of Systems  Gastrointestinal: Positive for abdominal pain and nausea.  All other systems reviewed and are negative.    Physical Exam Updated Vital Signs BP 133/85   Pulse 69   Temp 98.3 F (36.8 C) (Oral)   Resp 17   SpO2 100%   Physical Exam Vitals signs and nursing note reviewed.  Constitutional:      General: She is not in acute distress.    Appearance: Normal appearance. She is well-developed.  HENT:     Head: Normocephalic and atraumatic.     Right Ear: Hearing normal.     Left Ear: Hearing normal.     Nose: Nose normal.  Eyes:     Conjunctiva/sclera: Conjunctivae normal.     Pupils: Pupils are equal, round, and reactive to light.  Neck:     Musculoskeletal: Normal range of motion and neck supple.  Cardiovascular:     Rate and Rhythm: Regular rhythm.     Heart sounds: S1 normal and S2 normal. No murmur. No friction rub. No gallop.   Pulmonary:     Effort: Pulmonary effort is normal. No respiratory distress.     Breath sounds: Normal breath sounds.  Chest:     Chest wall: No tenderness.  Abdominal:     General: Bowel sounds are normal.     Palpations: Abdomen is soft.      Tenderness: There is abdominal tenderness in the left upper quadrant. There is no guarding or  rebound. Negative signs include Murphy's sign and McBurney's sign.     Hernia: No hernia is present.  Musculoskeletal: Normal range of motion.  Skin:    General: Skin is warm and dry.     Findings: No rash.  Neurological:     Mental Status: She is alert and oriented to person, place, and time.     GCS: GCS eye subscore is 4. GCS verbal subscore is 5. GCS motor subscore is 6.     Cranial Nerves: No cranial nerve deficit.     Sensory: No sensory deficit.     Coordination: Coordination normal.  Psychiatric:        Speech: Speech normal.        Behavior: Behavior normal.        Thought Content: Thought content normal.      ED Treatments / Results  Labs (all labs ordered are listed, but only abnormal results are displayed) Labs Reviewed  COMPREHENSIVE METABOLIC PANEL - Abnormal; Notable for the following components:      Result Value   Sodium 133 (*)    Potassium 3.4 (*)    CO2 21 (*)    All other components within normal limits  URINALYSIS, ROUTINE W REFLEX MICROSCOPIC - Abnormal; Notable for the following components:   Color, Urine COLORLESS (*)    Specific Gravity, Urine 1.001 (*)    All other components within normal limits  LIPASE, BLOOD  CBC  I-STAT BETA HCG BLOOD, ED ([facility], [facility], [facility] ONLY)    EKG EKG Interpretation  Date/Time:  Sunday March 28 2019 22:38:24 EDT Ventricular Rate:  70 PR Interval:  130 QRS Duration: 86 QT Interval:  372 QTC Calculation: 401 R Axis:   89 Text Interpretation:  Normal sinus rhythm with sinus arrhythmia Normal ECG Confirmed by Orpah Greek [id number]) on 03/28/2019 11:11:46 PM   Radiology Ct Abdomen Pelvis Wo Contrast  Result Date: 03/29/2019 CLINICAL DATA:  Left upper abdominal pain EXAM: CT ABDOMEN AND PELVIS WITHOUT CONTRAST TECHNIQUE: Multidetector CT imaging of the abdomen and pelvis was performed following the standard protocol  without IV contrast. COMPARISON:  05/21/2013 FINDINGS: Lower chest: Partially visualized breast prostheses. Mild mosaic density within the left greater than right lung base. Normal heart size Hepatobiliary: No focal liver abnormality is seen. Status post cholecystectomy. No biliary dilatation. Pancreas: Unremarkable. No pancreatic ductal dilatation or surrounding inflammatory changes. Spleen: Normal in size without focal abnormality. Adrenals/Urinary Tract: Adrenal glands are unremarkable. Kidneys are normal, without renal calculi, focal lesion, or hydronephrosis. Bladder is unremarkable. Stomach/Bowel: Stomach is within normal limits. Questionable thickening of left upper quadrant jejunal small bowel loops. Status post appendectomy. No colon wall thickening Vascular/Lymphatic: No significant vascular findings are present. No enlarged abdominal or pelvic lymph nodes. Reproductive: IUD in the uterus.  No adnexal mass Other: No abdominal wall hernia or abnormality. No abdominopelvic ascites. Musculoskeletal: No acute or significant osseous findings. IMPRESSION: 1. Questionable mild wall thickening of jejunal small bowel loops in the left upper quadrant as may be seen with enteritis of infectious or inflammatory etiology. 2. Mild left greater than right mosaic attenuation at the lung bases, possibly due to diffuse atelectasis or small airways disease. Electronically Signed   By: Donavan Foil M.D.   On: 03/29/2019 01:59    Procedures Procedures (including critical care time)  Medications Ordered in ED Medications  sodium chloride flush (NS) 0.9 % injection 3 mL (has no administration in time range)  metoCLOPramide (REGLAN) injection 10 mg (has no administration  in time range)  sodium chloride 0.9 % bolus 1,000 mL (0 mLs Intravenous Stopped 03/29/19 0103)  HYDROmorphone (DILAUDID) injection 1 mg (1 mg Intravenous Given 03/28/19 2342)  ondansetron (ZOFRAN) injection 4 mg (4 mg Intravenous Given 03/28/19 2343)   HYDROmorphone (DILAUDID) injection 1 mg (1 mg Intravenous Given 03/29/19 0110)  HYDROmorphone (DILAUDID) injection 1 mg (1 mg Intravenous Given 03/29/19 0337)     Initial Impression / Assessment and Plan / ED Course  I have reviewed the triage vital signs and the nursing notes.  Pertinent labs & imaging results that were available during my care of the patient were reviewed by me and considered in my medical decision making (see chart for details).        Patient presents to the emergency department for evaluation of abdominal pain.  Patient experiencing left upper abdominal pain for 2 days.  Pain radiates into the back and at times up into the chest.  She has had nausea but no vomiting, no diarrhea.  She does report that she had endoscopy years ago and was told she had a healing ulcer but has not had any hematemesis, rectal bleeding or melena.  She does admit to daily alcohol intake.  Symptoms could be secondary to alcoholic gastritis.  Pancreatitis was also considered but her lipase has returned normal.  Patient underwent CT scan that does not show any inflammatory changes around the pancreas, she does have possible regional enteritis in the area where she is hurting.  Final Clinical Impressions(s) / ED Diagnoses   Final diagnoses:  Enteritis  Left upper quadrant pain    ED Discharge Orders         Ordered    metroNIDAZOLE (FLAGYL) 500 MG tablet  3 times daily     03/29/19 0416    dicyclomine (BENTYL) 20 MG tablet  2 times daily     03/29/19 0416    ondansetron (ZOFRAN) 4 MG tablet  Every 6 hours PRN     03/29/19 0416    ciprofloxacin (CIPRO) 500 MG tablet  2 times daily     03/29/19 0416           Orpah Greek, MD 03/29/19 [phone]

## 2019-03-28 NOTE — ED Triage Notes (Signed)
Pt arrives with c/o left upper abdominal pain for about 2 days. Pain now radiates to her back and chest area. Reports as constant. Nausea. Of note, pt says she has had increased use of NSAIDS d/t recent headaches, no blood in stool.

## 2019-03-29 ENCOUNTER — Emergency Department (HOSPITAL_COMMUNITY): Payer: 59

## 2019-03-29 MED ORDER — DICYCLOMINE HCL 20 MG PO TABS: 20.0000 mg | ORAL_TABLET | Freq: Two times a day (BID) | ORAL | 0 refills | Status: DC

## 2019-03-29 MED ORDER — HYDROMORPHONE HCL 1 MG/ML IJ SOLN
1.0000 mg | Freq: Once | INTRAMUSCULAR | Status: AC
  Administered 2019-03-29: 1 mg via INTRAVENOUS
  Filled 2019-03-29: qty 1

## 2019-03-29 MED ORDER — METOCLOPRAMIDE HCL 5 MG/ML IJ SOLN
10.0000 mg | Freq: Once | INTRAMUSCULAR | Status: DC
  Filled 2019-03-29: qty 2

## 2019-03-29 MED ORDER — CIPROFLOXACIN HCL 500 MG PO TABS: 500.0000 mg | ORAL_TABLET | Freq: Two times a day (BID) | ORAL | 0 refills | Status: DC

## 2019-03-29 MED ORDER — ONDANSETRON HCL 4 MG PO TABS: 4.0000 mg | ORAL_TABLET | Freq: Four times a day (QID) | ORAL | 0 refills | Status: DC | PRN

## 2019-03-29 MED ORDER — METRONIDAZOLE 500 MG PO TABS: 500.0000 mg | ORAL_TABLET | Freq: Three times a day (TID) | ORAL | 0 refills | Status: DC

## 2019-03-29 NOTE — ED Notes (Signed)
Patient transported to CT 

## 2019-03-29 NOTE — ED Notes (Signed)
Pt able to ambulate to the bathroom w/o any issues

## 2019-03-30 ENCOUNTER — Telehealth: Payer: Self-pay | Admitting: Gastroenterology

## 2019-03-30 ENCOUNTER — Encounter: Payer: Self-pay | Admitting: General Surgery

## 2019-03-30 NOTE — Telephone Encounter (Signed)
We can move to the procedure but the area of the small bowel can not be reached with a scope that was abnormal on the CT.

## 2019-03-30 NOTE — Telephone Encounter (Signed)
Appt or move straight to procedures?

## 2019-03-30 NOTE — Telephone Encounter (Signed)
Pt left vm she had an EGD and Colonoscopy with  Dr. Allen Norris 5 years ago and is due for another one . She just left her physicians office who recommended her to be seen asap she had a CT scan done that showed Thickening on Upper Loop LQ she needs to schedule ASAP please call pt

## 2019-03-30 NOTE — Progress Notes (Signed)
Chart and imaging studies reviewed at the patient's request by text/ phone conversation of March 29, 2019.

## 2019-03-31 ENCOUNTER — Other Ambulatory Visit: Payer: Self-pay

## 2019-03-31 DIAGNOSIS — R933 Abnormal findings on diagnostic imaging of other parts of digestive tract: Secondary | ICD-10-CM

## 2019-03-31 MED ORDER — SUPREP BOWEL PREP KIT 17.5-3.13-1.6 GM/177ML PO SOLN: 1.0000 | ORAL | 0 refills | Status: DC

## 2019-03-31 NOTE — Telephone Encounter (Addendum)
What other options would be available if you cannot reach that area? Pt stated she spoke with Dr. Bary Castilla yesterday and he informed her if you would do an EGD you could go a little further down to that area. I told her I would let you know this information and call her back with your recommendation.

## 2019-04-01 ENCOUNTER — Encounter
Admission: RE | Admit: 2019-04-01 | Discharge: 2019-04-01 | Disposition: A | Discharge status: Home / Self Care | Payer: 59 | Source: Ambulatory Visit | Attending: Gastroenterology | Admitting: Gastroenterology

## 2019-04-01 ENCOUNTER — Other Ambulatory Visit: Payer: Self-pay

## 2019-04-01 ENCOUNTER — Encounter: Payer: Self-pay | Admitting: *Deleted

## 2019-04-01 DIAGNOSIS — Z20828 Contact with and (suspected) exposure to other viral communicable diseases: Secondary | ICD-10-CM | POA: Insufficient documentation

## 2019-04-01 DIAGNOSIS — Z01812 Encounter for preprocedural laboratory examination: Secondary | ICD-10-CM | POA: Diagnosis not present

## 2019-04-02 ENCOUNTER — Other Ambulatory Visit: Payer: Self-pay

## 2019-04-02 LAB — SARS CORONAVIRUS 2 (TAT 6-24 HRS): SARS Coronavirus 2: NEGATIVE

## 2019-04-02 NOTE — Discharge Instructions (Signed)

## 2019-04-05 ENCOUNTER — Ambulatory Visit: Payer: 59 | Admitting: Anesthesiology

## 2019-04-05 ENCOUNTER — Other Ambulatory Visit: Payer: Self-pay

## 2019-04-05 ENCOUNTER — Ambulatory Visit
Admission: RE | Admit: 2019-04-05 | Discharge: 2019-04-05 | Disposition: A | Discharge status: Home / Self Care | Payer: 59 | Attending: Gastroenterology | Admitting: Gastroenterology

## 2019-04-05 ENCOUNTER — Encounter
Admission: RE | Disposition: A | Discharge status: Home / Self Care | Payer: Self-pay | Source: Home / Self Care | Attending: Gastroenterology

## 2019-04-05 DIAGNOSIS — R1012 Left upper quadrant pain: Secondary | ICD-10-CM | POA: Diagnosis not present

## 2019-04-05 DIAGNOSIS — Z79899 Other long term (current) drug therapy: Secondary | ICD-10-CM | POA: Diagnosis not present

## 2019-04-05 DIAGNOSIS — Z1211 Encounter for screening for malignant neoplasm of colon: Secondary | ICD-10-CM | POA: Diagnosis not present

## 2019-04-05 DIAGNOSIS — K2950 Unspecified chronic gastritis without bleeding: Secondary | ICD-10-CM | POA: Insufficient documentation

## 2019-04-05 DIAGNOSIS — F17210 Nicotine dependence, cigarettes, uncomplicated: Secondary | ICD-10-CM | POA: Diagnosis not present

## 2019-04-05 DIAGNOSIS — F419 Anxiety disorder, unspecified: Secondary | ICD-10-CM | POA: Diagnosis not present

## 2019-04-05 DIAGNOSIS — K2970 Gastritis, unspecified, without bleeding: Secondary | ICD-10-CM | POA: Diagnosis not present

## 2019-04-05 DIAGNOSIS — R933 Abnormal findings on diagnostic imaging of other parts of digestive tract: Secondary | ICD-10-CM

## 2019-04-05 DIAGNOSIS — G43909 Migraine, unspecified, not intractable, without status migrainosus: Secondary | ICD-10-CM | POA: Insufficient documentation

## 2019-04-05 DIAGNOSIS — Z86010 Personal history of colonic polyps: Secondary | ICD-10-CM | POA: Diagnosis not present

## 2019-04-05 HISTORY — PX: ESOPHAGOGASTRODUODENOSCOPY (EGD) WITH PROPOFOL: SHX5813

## 2019-04-05 HISTORY — DX: Other specified postprocedural states: R11.2

## 2019-04-05 HISTORY — PX: COLONOSCOPY WITH PROPOFOL: SHX5780

## 2019-04-05 HISTORY — PX: BIOPSY: SHX5522

## 2019-04-05 HISTORY — DX: Motion sickness, initial encounter: T75.3XXA

## 2019-04-05 HISTORY — DX: Nausea with vomiting, unspecified: Z98.890

## 2019-04-05 LAB — POCT PREGNANCY, URINE: Preg Test, Ur: NEGATIVE

## 2019-04-05 SURGERY — COLONOSCOPY WITH PROPOFOL
Anesthesia: General | Site: Throat

## 2019-04-05 MED ORDER — ACETAMINOPHEN 160 MG/5ML PO SOLN: 325.0000 mg | Freq: Once | ORAL | Status: DC

## 2019-04-05 MED ORDER — STERILE WATER FOR IRRIGATION IR SOLN
Status: DC | PRN
  Administered 2019-04-05: 100 mL

## 2019-04-05 MED ORDER — PANTOPRAZOLE SODIUM 40 MG PO TBEC: 40.0000 mg | DELAYED_RELEASE_TABLET | Freq: Every day | ORAL | 1 refills | Status: DC

## 2019-04-05 MED ORDER — LACTATED RINGERS IV SOLN
INTRAVENOUS | Status: DC
  Administered 2019-04-05: 12:00:00 via INTRAVENOUS

## 2019-04-05 MED ORDER — ACETAMINOPHEN 325 MG PO TABS: 325.0000 mg | ORAL_TABLET | Freq: Once | ORAL | Status: DC

## 2019-04-05 MED ORDER — LIDOCAINE HCL (CARDIAC) PF 100 MG/5ML IV SOSY
PREFILLED_SYRINGE | INTRAVENOUS | Status: DC | PRN
  Administered 2019-04-05: 30 mg via INTRAVENOUS

## 2019-04-05 MED ORDER — GLYCOPYRROLATE 0.2 MG/ML IJ SOLN
INTRAMUSCULAR | Status: DC | PRN
  Administered 2019-04-05: 0.1 mg via INTRAVENOUS

## 2019-04-05 MED ORDER — SODIUM CHLORIDE 0.9 % IV SOLN: INTRAVENOUS | Status: DC

## 2019-04-05 MED ORDER — ONDANSETRON HCL 4 MG/2ML IJ SOLN
4.0000 mg | Freq: Once | INTRAMUSCULAR | Status: AC
  Administered 2019-04-05: 4 mg via INTRAVENOUS

## 2019-04-05 MED ORDER — PROPOFOL 10 MG/ML IV BOLUS
INTRAVENOUS | Status: DC | PRN
  Administered 2019-04-05: 30 mg via INTRAVENOUS
  Administered 2019-04-05: 20 mg via INTRAVENOUS
  Administered 2019-04-05: 50 mg via INTRAVENOUS
  Administered 2019-04-05 (×2): 40 mg via INTRAVENOUS
  Administered 2019-04-05: 30 mg via INTRAVENOUS
  Administered 2019-04-05: 150 mg via INTRAVENOUS
  Administered 2019-04-05 (×2): 40 mg via INTRAVENOUS
  Administered 2019-04-05: 20 mg via INTRAVENOUS
  Administered 2019-04-05: 50 mg via INTRAVENOUS

## 2019-04-05 SURGICAL SUPPLY — 36 items
BALLN DILATOR 10-12 8 (BALLOONS)
BALLN DILATOR 12-15 8 (BALLOONS)
BALLN DILATOR 15-18 8 (BALLOONS)
BALLN DILATOR CRE 0-12 8 (BALLOONS)
BALLN DILATOR ESOPH 8 10 CRE (MISCELLANEOUS) IMPLANT
BALLOON DILATOR 12-15 8 (BALLOONS) IMPLANT
BALLOON DILATOR 15-18 8 (BALLOONS) IMPLANT
BALLOON DILATOR CRE 0-12 8 (BALLOONS) IMPLANT
BLOCK BITE 60FR ADLT L/F GRN (MISCELLANEOUS) ×3 IMPLANT
CANISTER SUCT 1200ML W/VALVE (MISCELLANEOUS) ×3 IMPLANT
CLIP HMST 235XBRD CATH ROT (MISCELLANEOUS) IMPLANT
CLIP RESOLUTION 360 11X235 (MISCELLANEOUS)
ELECT REM PT RETURN 9FT ADLT (ELECTROSURGICAL)
ELECTRODE REM PT RTRN 9FT ADLT (ELECTROSURGICAL) IMPLANT
FCP ESCP3.2XJMB 240X2.8X (MISCELLANEOUS)
FORCEPS BIOP RAD 4 LRG CAP 4 (CUTTING FORCEPS) ×1 IMPLANT
FORCEPS BIOP RJ4 240 W/NDL (MISCELLANEOUS)
FORCEPS ESCP3.2XJMB 240X2.8X (MISCELLANEOUS) IMPLANT
GOWN CVR UNV OPN BCK APRN NK (MISCELLANEOUS) ×4 IMPLANT
GOWN ISOL THUMB LOOP REG UNIV (MISCELLANEOUS) ×6
INJECTOR VARIJECT VIN23 (MISCELLANEOUS) IMPLANT
KIT DEFENDO VALVE AND CONN (KITS) IMPLANT
KIT ENDO PROCEDURE OLY (KITS) ×3 IMPLANT
MARKER SPOT ENDO TATTOO 5ML (MISCELLANEOUS) IMPLANT
PROBE APC STR FIRE (PROBE) IMPLANT
RETRIEVER NET PLAT FOOD (MISCELLANEOUS) IMPLANT
RETRIEVER NET ROTH 2.5X230 LF (MISCELLANEOUS) IMPLANT
SNARE SHORT THROW 13M SML OVAL (MISCELLANEOUS) IMPLANT
SNARE SHORT THROW 30M LRG OVAL (MISCELLANEOUS) IMPLANT
SNARE SNG USE RND 15MM (INSTRUMENTS) IMPLANT
SPOT EX ENDOSCOPIC TATTOO (MISCELLANEOUS)
SYR INFLATION 60ML (SYRINGE) IMPLANT
TRAP ETRAP POLY (MISCELLANEOUS) IMPLANT
VARIJECT INJECTOR VIN23 (MISCELLANEOUS)
WATER STERILE IRR 250ML POUR (IV SOLUTION) ×3 IMPLANT
WIRE CRE 18-20MM 8CM F G (MISCELLANEOUS) IMPLANT

## 2019-04-05 NOTE — H&P (Signed)
Lucilla Lame, MD [address] [place], [place] [address] Phone:[phone] Fax : [phone]  Primary Care Physician:  Crist Infante, MD Primary Gastroenterologist:  Dr. Allen Norris  Pre-Procedure History & Physical: HPI:  Ashley Hancock is a 38 y.o. female is here for an endoscopy and colonoscopy.   Past Medical History:  Diagnosis Date  . Anxiety   . Blood transfusion without reported diagnosis    1unit post laparotomy 2015  . Dysrhythmia    irreg HR and PVCs  . Heart murmur   . History of gastric ulcer   . IBS (irritable bowel syndrome)   . Migraines   . Motion sickness   . PONV (postoperative nausea and vomiting)    nausea only    Past Surgical History:  Procedure Laterality Date  . APPENDECTOMY    . AUGMENTATION MAMMAPLASTY Bilateral 2018  . CHOLECYSTECTOMY  2011  . HEMORRHOID SURGERY N/A 12/18/2015   Procedure:  I & D THROMBUS HEMORRHOID;  Surgeon: Jules Husbands, MD;  Location: [facility] ORS;  Service: General;  Laterality: N/A;  . LAPAROSCOPY    . LAPAROTOMY     x2  . RHINOPLASTY  2012  . WISDOM TOOTH EXTRACTION      Prior to Admission medications   Medication Sig Start Date End Date Taking? Authorizing Provider  butalbital-acetaminophen-caffeine (FIORICET) 50-325-40 MG tablet Take 1 tablet by mouth every 4 (four) hours as needed for headache.   Yes [provider]  cyclobenzaprine (FLEXERIL) 5 MG tablet Take 5 mg by mouth 3 (three) times daily as needed for muscle spasms.   Yes [provider]  dicyclomine (BENTYL) 20 MG tablet Take 1 tablet (20 mg total) by mouth 2 (two) times daily. 03/29/19  Yes Pollina, Gwenyth Allegra, MD  Dupilumab (DUPIXENT) 300 MG/2ML SOSY Inject 300 mg into the skin every 14 (fourteen) days. Inject 1 syringe every 2 weeks Patient taking differently: Inject 300 mg into the skin every 14 (fourteen) days.  04/17/18  Yes Tresa Garter, MD  famotidine (PEPCID) 20 MG tablet Take 20 mg by mouth 2 (two) times  daily as needed for heartburn or indigestion.   Yes [provider]  FLUoxetine (PROZAC) 20 MG tablet Take 20 mg by mouth daily.   Yes [provider]  levonorgestrel (MIRENA) 20 MCG/24HR IUD 1 each by Intrauterine route once.   Yes [provider]  LORazepam (ATIVAN) 0.5 MG tablet Take 0.5 mg by mouth daily as needed for anxiety.   Yes [provider]  Na Sulfate-K Sulfate-Mg Sulf (SUPREP BOWEL PREP KIT) 17.5-3.13-1.6 GM/177ML SOLN Take 1 kit by mouth as directed. 03/31/19  Yes Lucilla Lame, MD  ondansetron (ZOFRAN) 4 MG tablet Take 1 tablet (4 mg total) by mouth every 6 (six) hours as needed for nausea or vomiting. 03/29/19  Yes Pollina, Gwenyth Allegra, MD  oxyCODONE-acetaminophen (PERCOCET) 5-325 MG tablet Take 1-2 tablets by mouth every 4 (four) hours as needed for severe pain.   Yes [provider]  rizatriptan (MAXALT) 10 MG tablet Take 10 mg by mouth as needed for migraine. May repeat in 2 hours if needed   Yes [provider]  albuterol (PROVENTIL HFA;VENTOLIN HFA) 108 (90 Base) MCG/ACT inhaler Inhale 2 puffs into the lungs every 6 (six) hours as needed for wheezing or shortness of breath. Patient not taking: Reported on 04/05/2019 07/10/18   Benjamine Mola, FNP  ciprofloxacin (CIPRO) 500 MG tablet Take 1 tablet (500 mg total) by mouth 2 (two)  times daily. One po bid x 7 days Patient not taking: Reported on 04/01/2019 03/29/19   Orpah Greek, MD  fluticasone [facility]) 50 MCG/ACT nasal spray Place 2 sprays into both nostrils daily. Patient not taking: Reported on 03/29/2019 09/23/18   Evelina Dun A, FNP  ibuprofen (ADVIL) 200 MG tablet Take 200 mg by mouth every 6 (six) hours as needed for headache.    [provider]  metroNIDAZOLE (FLAGYL) 500 MG tablet Take 1 tablet (500 mg total) by mouth 3 (three) times daily. Patient not taking: Reported on 04/01/2019 03/29/19   Orpah Greek, MD  pantoprazole (PROTONIX) 40 MG  tablet Take 1 tablet (40 mg total) by mouth daily. 04/05/19 04/04/20  Lucilla Lame, MD    Allergies as of 03/31/2019 - Review Complete 03/29/2019  Allergen Reaction Noted  . Bacitracin Rash 12/18/2015    Family History  Problem Relation Age of Onset  . Hypertension Mother   . Thyroid disease Mother   . Bipolar disorder Mother   . Heart disease Father        CHF;MI  . Diabetes Father   . Breast cancer Neg Hx     Social History   Socioeconomic History  . Marital status: Married    Spouse name: Not on file  . Number of children: 1  . Years of education: 85  . Highest education level: Not on file  Occupational History  . Not on file  Social Needs  . Financial resource strain: Not on file  . Food insecurity    Worry: Not on file    Inability: Not on file  . Transportation needs    Medical: Not on file    Non-medical: Not on file  Tobacco Use  . Smoking status: Current Every Day Smoker    Packs/day: 0.50    Years: 20.00    Pack years: 10.00    Types: Cigarettes  . Smokeless tobacco: Never Used  . Tobacco comment: started age 46  Substance and Sexual Activity  . Alcohol use: Yes    Alcohol/week: 8.0 standard drinks    Types: 8 Glasses of wine per week  . Drug use: No  . Sexual activity: Yes    Partners: Male    Birth control/protection: None  Lifestyle  . Physical activity    Days per week: Not on file    Minutes per session: Not on file  . Stress: Not on file  Relationships  . Social Herbalist on phone: Not on file    Gets together: Not on file    Attends religious service: Not on file    Active member of club or organization: Not on file    Attends meetings of clubs or organizations: Not on file    Relationship status: Not on file  . Intimate partner violence    Fear of current or ex partner: Not on file    Emotionally abused: Not on file    Physically abused: Not on file    Forced sexual activity: Not on file  Other Topics Concern  . Not on  file  Social History Narrative  . Not on file    Review of Systems: See HPI, otherwise negative ROS  Physical Exam: BP 109/64   Pulse 86   Temp (!) 97.2 F (36.2 C)   Resp (!) 24   Ht '5\' 7"'  (1.702 m)   Wt 60.3 kg   SpO2 97%   BMI 20.83 kg/m  General:  Alert,  pleasant and cooperative in NAD Head:  Normocephalic and atraumatic. Neck:  Supple; no masses or thyromegaly. Lungs:  Clear throughout to auscultation.    Heart:  Regular rate and rhythm. Abdomen:  Soft, nontender and nondistended. Normal bowel sounds, without guarding, and without rebound.   Neurologic:  Alert and  oriented x4;  grossly normal neurologically.  Impression/Plan: Velna Ochs Fitzpatrick is here for an endoscopy and colonoscopy to be performed for LUQ pain and history of adenomatous polyps  Risks, benefits, limitations, and alternatives regarding  endoscopy and colonoscopy have been reviewed with the patient.  Questions have been answered.  All parties agreeable.   Lucilla Lame, MD  04/05/2019, 1:27 PM

## 2019-04-05 NOTE — Op Note (Signed)
[facility]astroenterology Patient Name: Ashley Hancock Procedure Date: 04/05/2019 12:50 PM MRN: [id number] Account #: [id number] Date of Birth: May 11, 1981 Admit Type: Outpatient Age: 38 Room: [facility] OR ROOM 01 Gender: Female Note Status: Finalized Procedure:            Upper GI endoscopy Indications:          Abdominal pain in the left upper quadrant Providers:            Lucilla Lame MD, MD Referring MD:         Jeannette How. Perini, MD (Referring MD) Medicines:            Propofol per Anesthesia Complications:        No immediate complications. Procedure:            Pre-Anesthesia Assessment:                       - Prior to the procedure, a History and Physical was                        performed, and patient medications and allergies were                        reviewed. The patient's tolerance of previous                        anesthesia was also reviewed. The risks and benefits of                        the procedure and the sedation options and risks were                        discussed with the patient. All questions were                        answered, and informed consent was obtained. Prior                        Anticoagulants: The patient has taken no previous                        anticoagulant or antiplatelet agents. ASA Grade                        Assessment: II - A patient with mild systemic disease.                        After reviewing the risks and benefits, the patient was                        deemed in satisfactory condition to undergo the                        procedure.                       After obtaining informed consent, the endoscope was                        passed under direct vision. Throughout the procedure,  the patient's blood pressure, pulse, and oxygen                        saturations were monitored continuously. The                        Colonoscope was introduced through the mouth, and                advanced to the jejunum. The upper GI endoscopy was                        accomplished without difficulty. The patient tolerated                        the procedure well. Findings:      The examined esophagus was normal.      Diffuse severe inflammation characterized by erosions and erythema was       found in the entire examined stomach. Biopsies were taken with a cold       forceps for histology.      The examined duodenum was normal.      The examined jejunum was normal. Impression:           - Normal esophagus.                       - Gastritis. Biopsied.                       - Normal examined duodenum.                       - Normal examined jejunum. Recommendation:       - Await pathology results.                       - Discharge patient to home.                       - Resume previous diet.                       - Continue present medications.                       - Use a proton pump inhibitor PO daily. Procedure Code(s):    --- Professional ---                       [phone], Esophagogastroduodenoscopy, flexible, transoral;                        with biopsy, single or multiple Diagnosis Code(s):    --- Professional ---                       R10.12, Left upper quadrant pain                       K29.70, Gastritis, unspecified, without bleeding CPT copyright 2019 American Medical Association. All rights reserved. The codes documented in this report are preliminary and upon coder review may  be revised to meet current compliance requirements. Lucilla Lame MD, MD 04/05/2019 1:09:43 PM This report has been signed electronically. Number of Addenda: 0 Note Initiated  On: 04/05/2019 12:50 PM Total Procedure Duration: 0 hours 11 minutes 39 seconds  Estimated Blood Loss: Estimated blood loss: none.      [facility]

## 2019-04-05 NOTE — Anesthesia Preprocedure Evaluation (Signed)
Anesthesia Evaluation  Patient identified by MRN, date of birth, ID band Patient awake    Reviewed: Allergy & Precautions, H&P , NPO status , Patient's Chart, lab work & pertinent test results  Airway Mallampati: II  TM Distance: >3 FB Neck ROM: full    Dental no notable dental hx.    Pulmonary Current SmokerPatient did not abstain from smoking.,    Pulmonary exam normal breath sounds clear to auscultation       Cardiovascular Normal cardiovascular exam Rhythm:regular Rate:Normal     Neuro/Psych    GI/Hepatic   Endo/Other    Renal/GU      Musculoskeletal   Abdominal   Peds  Hematology   Anesthesia Other Findings   Reproductive/Obstetrics                             Anesthesia Physical Anesthesia Plan  ASA: II  Anesthesia Plan: General   Post-op Pain Management:    Induction: Intravenous  PONV Risk Score and Plan: 3 and Propofol infusion, Treatment may vary due to age or medical condition and TIVA  Airway Management Planned: Natural Airway  Additional Equipment:   Intra-op Plan:   Post-operative Plan:   Informed Consent: I have reviewed the patients History and Physical, chart, labs and discussed the procedure including the risks, benefits and alternatives for the proposed anesthesia with the patient or authorized representative who has indicated his/her understanding and acceptance.       Plan Discussed with: CRNA  Anesthesia Plan Comments:         Anesthesia Quick Evaluation

## 2019-04-05 NOTE — Transfer of Care (Signed)
Immediate Anesthesia Transfer of Care Note  Patient: Ashley Hancock  Procedure(s) Performed: COLONOSCOPY WITH PROPOFOL (N/A Rectum) ESOPHAGOGASTRODUODENOSCOPY (EGD) WITH PROPOFOL (N/A Throat) BIOPSY (N/A Throat)  Patient Location: PACU  Anesthesia Type: General  Level of Consciousness: awake, alert  and patient cooperative  Airway and Oxygen Therapy: Patient Spontanous Breathing and Patient connected to supplemental oxygen  Post-op Assessment: Post-op Vital signs reviewed, Patient's Cardiovascular Status Stable, Respiratory Function Stable, Patent Airway and No signs of Nausea or vomiting  Post-op Vital Signs: Reviewed and stable  Complications: No apparent anesthesia complications

## 2019-04-05 NOTE — Anesthesia Postprocedure Evaluation (Signed)
Anesthesia Post Note  Patient: Ashley Hancock  Procedure(s) Performed: COLONOSCOPY WITH PROPOFOL (N/A Rectum) ESOPHAGOGASTRODUODENOSCOPY (EGD) WITH PROPOFOL (N/A Throat) BIOPSY (N/A Throat)  Patient location during evaluation: PACU Anesthesia Type: General Level of consciousness: awake and alert and oriented Pain management: satisfactory to patient Vital Signs Assessment: post-procedure vital signs reviewed and stable Respiratory status: spontaneous breathing, nonlabored ventilation and respiratory function stable Cardiovascular status: blood pressure returned to baseline and stable Postop Assessment: Adequate PO intake and No signs of nausea or vomiting Anesthetic complications: no    Raliegh Ip

## 2019-04-05 NOTE — Anesthesia Procedure Notes (Signed)
Date/Time: 04/05/2019 12:51 PM Performed by: Cameron Ali, CRNA Pre-anesthesia Checklist: Patient identified, Emergency Drugs available, Suction available, Timeout performed and Patient being monitored Patient Re-evaluated:Patient Re-evaluated prior to induction Oxygen Delivery Method: Nasal cannula Placement Confirmation: positive ETCO2

## 2019-04-05 NOTE — Op Note (Signed)
[facility]astroenterology Patient Name: Ashley Hancock Procedure Date: 04/05/2019 12:46 PM MRN: [id number] Account #: [id number] Date of Birth: [birth year] Admit Type: Outpatient Age: 38 Room: [facility] OR ROOM 01 Gender: Female Note Status: Finalized Procedure:            Colonoscopy Indications:          High risk colon cancer surveillance: Personal history                        of colonic polyps Providers:            Lucilla Lame MD, MD Medicines:            Propofol per Anesthesia Complications:        No immediate complications. Procedure:            Pre-Anesthesia Assessment:                       - Prior to the procedure, a History and Physical was                        performed, and patient medications and allergies were                        reviewed. The patient's tolerance of previous                        anesthesia was also reviewed. The risks and benefits of                        the procedure and the sedation options and risks were                        discussed with the patient. All questions were                        answered, and informed consent was obtained. Prior                        Anticoagulants: The patient has taken no previous                        anticoagulant or antiplatelet agents. ASA Grade                        Assessment: II - A patient with mild systemic disease.                        After reviewing the risks and benefits, the patient was                        deemed in satisfactory condition to undergo the                        procedure.                       After obtaining informed consent, the colonoscope was                        passed under direct vision. Throughout the  procedure,                        the patient's blood pressure, pulse, and oxygen                        saturations were monitored continuously. The was                        introduced through the anus and advanced to the the              cecum, identified by appendiceal orifice and ileocecal                        valve. The colonoscopy was performed without                        difficulty. The patient tolerated the procedure well.                        The quality of the bowel preparation was excellent. Findings:      The perianal and digital rectal examinations were normal.      The colon (entire examined portion) appeared normal. Impression:           - The entire examined colon is normal.                       - No specimens collected. Recommendation:       - Discharge patient to home.                       - Resume previous diet.                       - Continue present medications.                       - Repeat colonoscopy in 5 years for surveillance. Procedure Code(s):    --- Professional ---                       [phone], Colonoscopy, flexible; diagnostic, including                        collection of specimen(s) by brushing or washing, when                        performed (separate procedure) Diagnosis Code(s):    --- Professional ---                       Z86.010, Personal history of colonic polyps CPT copyright 2019 American Medical Association. All rights reserved. The codes documented in this report are preliminary and upon coder review may  be revised to meet current compliance requirements. Lucilla Lame MD, MD 04/05/2019 1:21:09 PM This report has been signed electronically. Number of Addenda: 0 Note Initiated On: 04/05/2019 12:46 PM Scope Withdrawal Time: 0 hours 7 minutes 1 second  Total Procedure Duration: 0 hours 8 minutes 50 seconds  Estimated Blood Loss: Estimated blood loss: none.      [facility]

## 2019-04-06 ENCOUNTER — Encounter: Payer: Self-pay | Admitting: Gastroenterology

## 2019-04-08 ENCOUNTER — Telehealth: Payer: 59 | Admitting: Nurse Practitioner

## 2019-04-08 ENCOUNTER — Telehealth: Payer: Self-pay

## 2019-04-08 DIAGNOSIS — B029 Zoster without complications: Secondary | ICD-10-CM | POA: Diagnosis not present

## 2019-04-08 MED ORDER — GABAPENTIN 300 MG PO CAPS: 300.0000 mg | ORAL_CAPSULE | Freq: Three times a day (TID) | ORAL | 3 refills | Status: DC

## 2019-04-08 MED ORDER — VALACYCLOVIR HCL 1 G PO TABS: 1000.0000 mg | ORAL_TABLET | Freq: Three times a day (TID) | ORAL | 0 refills | Status: DC

## 2019-04-08 NOTE — Telephone Encounter (Signed)
-----   Message from Lucilla Lame, MD sent at 04/08/2019 10:26 AM EDT ----- Please let the patient know that the biopsies did not show any infection or worrisome features.  The left side abdominal pain is likely caused by the significant inflammation that was seen on the upper endoscopy.  As far as the CT showing possible thickening of small bowel, none of that was seen with the procedure.  It is still concerned about that thickening a repeat CT scan can be ordered.

## 2019-04-08 NOTE — Progress Notes (Signed)
E-visit for Shingles   We are sorry that you are not feeling well. Here is how we plan to help!  Based on what you shared with me it looks like you have shingles.  Shingles or herpes zoster, is a common infection of the nerves.  It is a painful rash caused by the herpes zoster virus.  This is the same virus that causes chickenpox.  After a person has chickenpox, the virus remains inactive in the nerve cells.  Years later, the virus can become active again and travel to the skin.  It typically will appear on one side of the face or body.  Burning or shooting pain, tingling, or itching are early signs of the infection.  Blisters typically scab over in 7 to 10 days and clear up within 2-4 weeks. Shingles is only contagious to people that have never had the chickenpox, the chickenpox vaccine, or anyone who has a compromised immune system.  You should avoid contact with these type of people until your blisters scab over.  I have prescribed Valacyclovir 1g three times daily for 7 days and also Gabapentin 300mg twice daily as needed for pain   HOME CARE: Apply ice packs (wrapped in a thin towel), cool compresses, or soak in cool bath to help reduce pain. Use calamine lotion to calm itchy skin. Avoid scratching the rash. Avoid direct sunlight.  GET HELP RIGHT AWAY IF: Symptoms that don't away after treatment. A rash or blisters near your eye. Increased drainage, fever, or rash after treatment. Severe pain that doesn't go away.   MAKE SURE YOU   Understand these instructions. Will watch your condition. Will get help right away if you are not doing well or get worse.  Thank you for choosing an e-visit.  Your e-visit answers were reviewed by a board certified advanced clinical practitioner to complete your personal care plan. Depending upon the condition, your plan could have included both over the counter or prescription medications.  Please review your pharmacy choice. Make sure the pharmacy  is open so you can pick up prescription now. If there is a problem, you may contact your provider through MyChart messaging and have the prescription routed to another pharmacy.  Your safety is important to us. If you have drug allergies check your prescription carefully.   For the next 24 hours you can use MyChart to ask questions about today's visit, request a non-urgent call back, or ask for a work or school excuse. You will get an email in the next two days asking about your experience. I hope that your e-visit has been valuable and will speed your recovery.  5-10 minutes spent reviewing and documenting in chart.  

## 2019-04-08 NOTE — Telephone Encounter (Signed)
Left vm for a return call. MyChart message sent to pt as well.

## 2019-06-02 IMAGING — US US BREAST*R* LIMITED INC AXILLA
1 series · 8 of 8 positions shown · non-contrast
Comparison: None.

ADDENDUM:
The patient returned today for scheduled biopsy of the palpable mass
in the lower right breast at the 6 o'clock position. The pre biopsy
ultrasound today demonstrated an oval circumscribed hypoechoic mass
connecting with a surgical scar and leading to the skin surface.
These findings are most consistent with postsurgical change.
Additional smaller similar appearing oval circumscribed hypoechoic
masses are seen in the lower right breast subjacent to the skin scar
compatible with developing oil cysts.

Given the benign sonographic features, biopsy was deferred and after
discussion with the patient six-month follow-up right breast
ultrasound was instead recommended. The patient demonstrates
understanding and agrees with the plan.
BI-RADS category: 3: Probably benign.
CLINICAL DATA: 36-year-old female with a right breast palpable
abnormality. History of bilateral breast augmentation mammoplasty
approximately 1 year prior.
EXAM:
2D DIGITAL DIAGNOSTIC BILATERAL MAMMOGRAM WITH IMPLANTS, CAD AND
ADJUNCT TOMO
The patient has bilateral subpectoral silicone implants. Standard
and implant displaced views were performed.

[Series 1: us breast*right* limited inc axilla · 0.03mm/px · 8 of 8 slices shown]
[im 1/8]
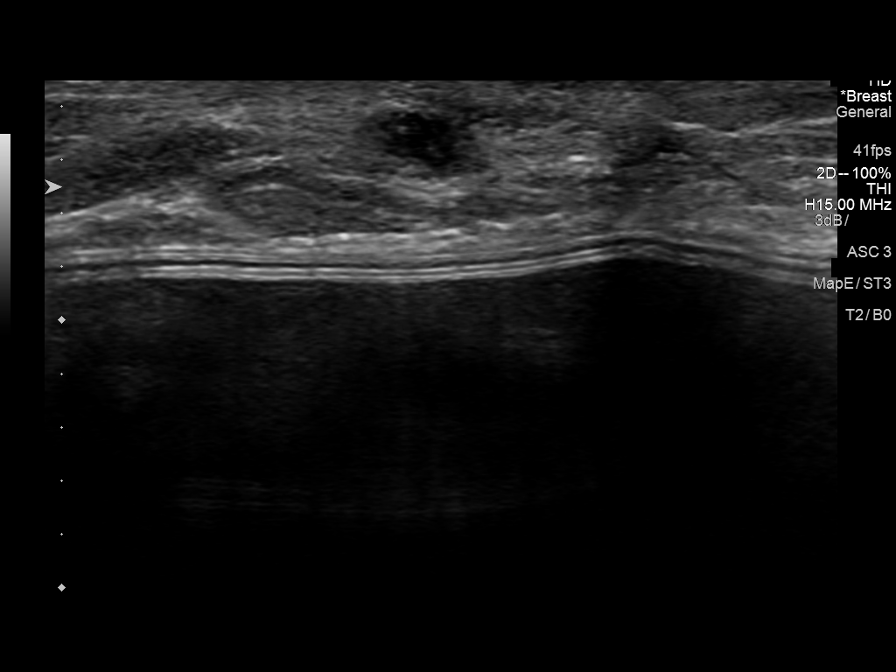
[im 2/8]
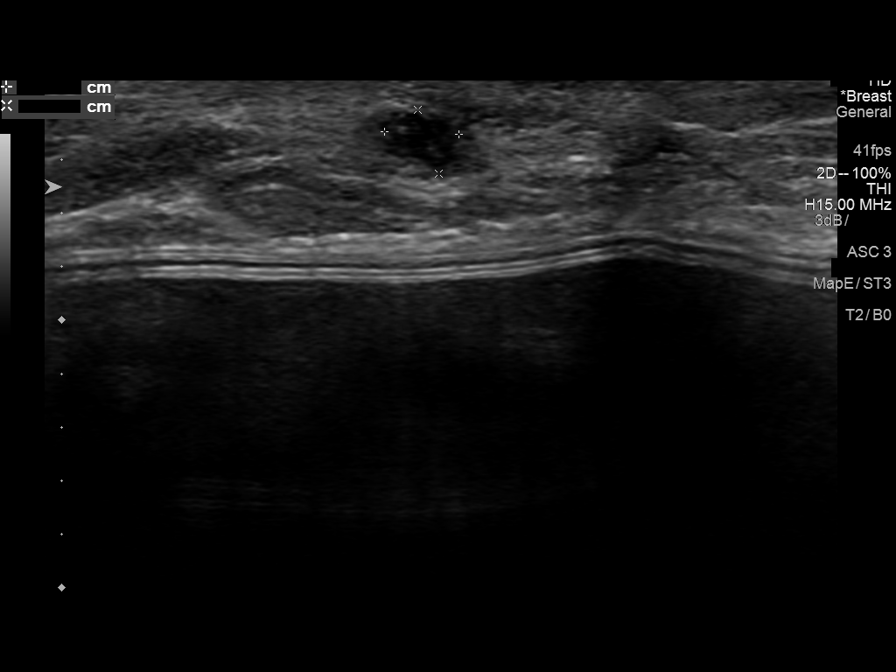
[im 3/8]
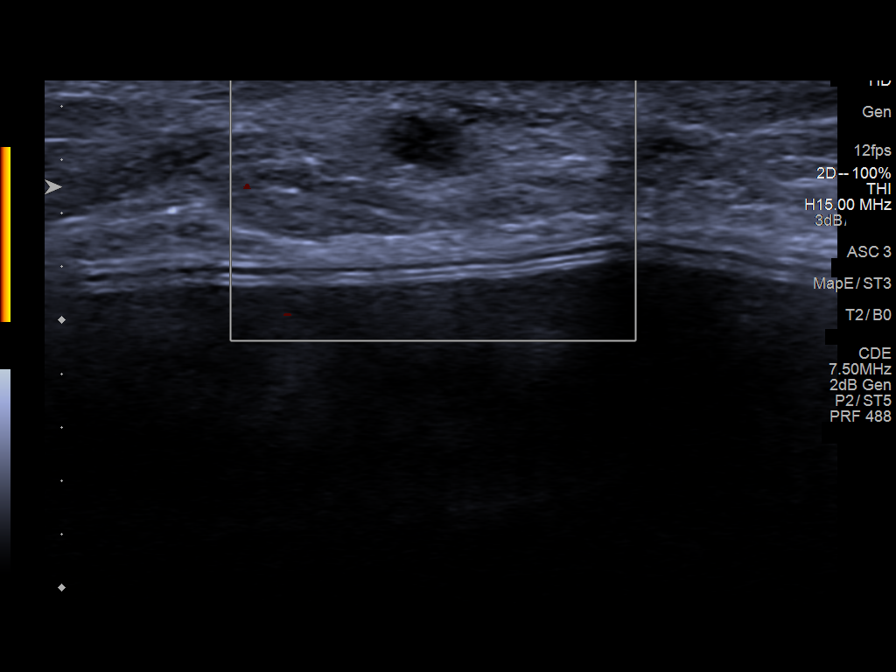
[im 4/8]
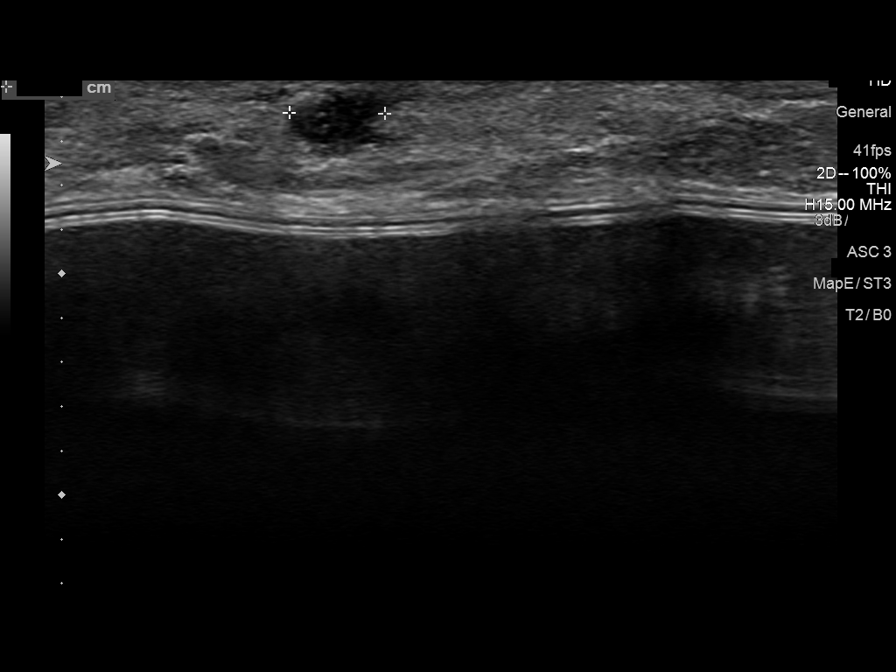
[im 5/8]
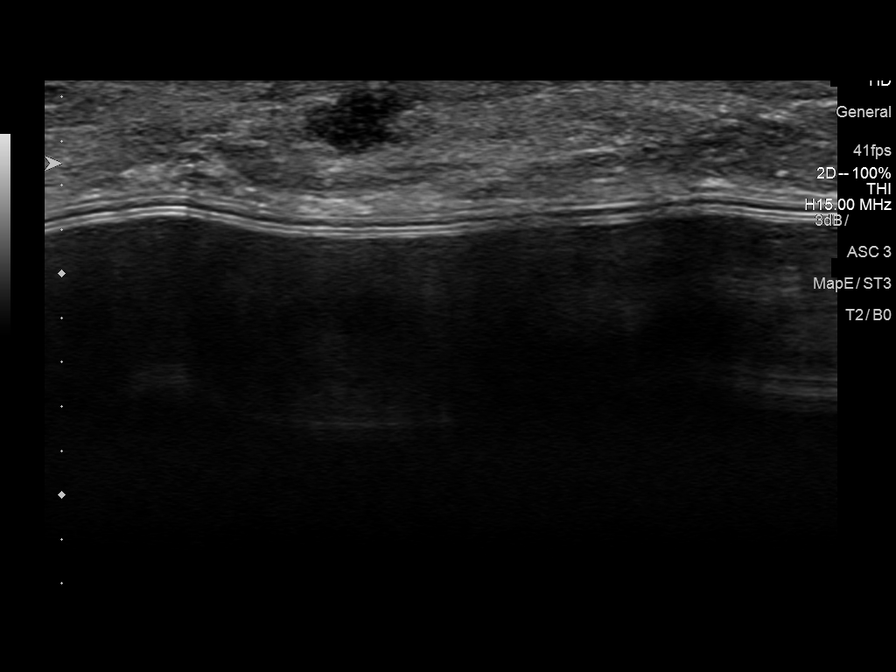
[im 6/8]
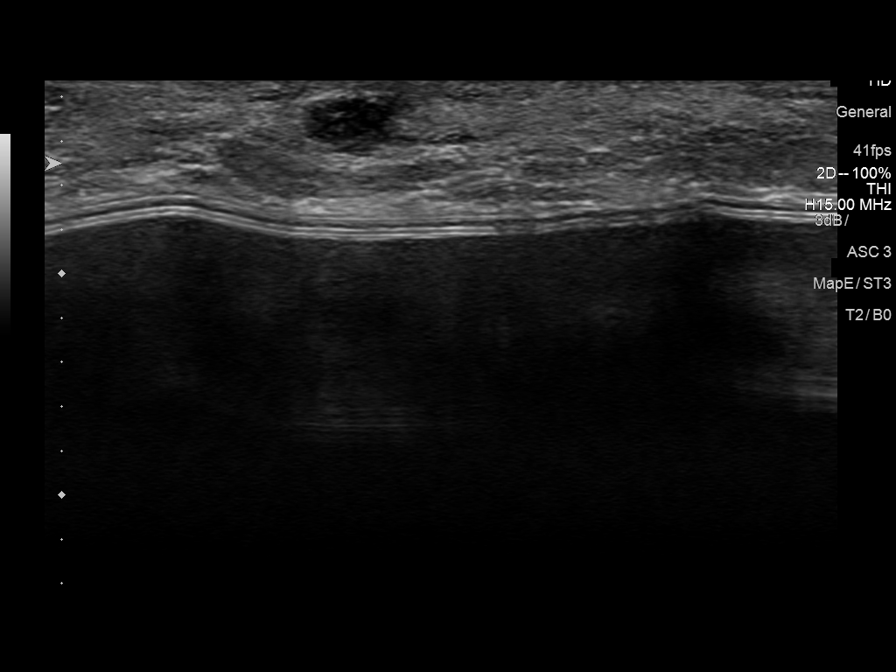
[im 7/8]
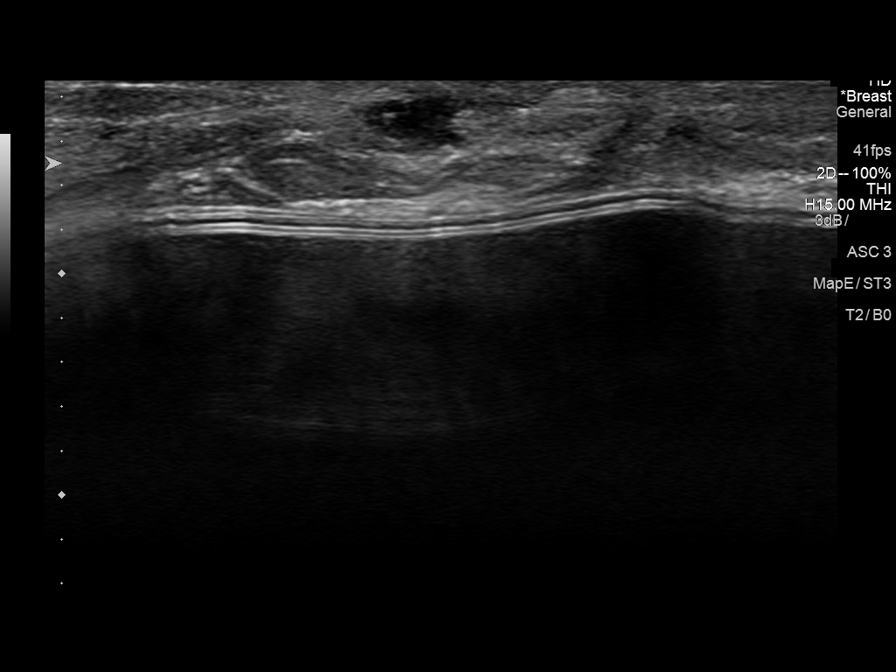
[im 8/8]
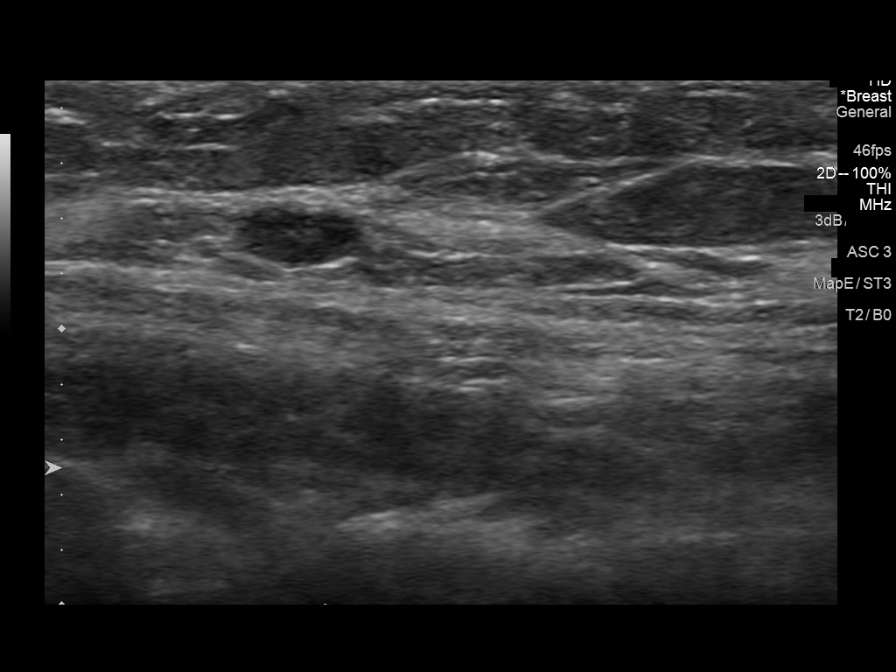

[8 of 8 positions shown; findings below may reference images not displayed]

ACR Breast Density Category c: The breast tissue is heterogeneously
dense, which may obscure small masses.
FINDINGS: There is a mass with margin irregularity at site of palpable concern
in the lower right breast best seen on the MLO implant displaced
images. No suspicious masses or calcifications are seen in the left
breast.

Mammographic images were processed with CAD.

Physical examination at site of palpable concern reveals a small
firm area of nodularity near the surgical scar at the approximate 6
o'clock position.

Targeted ultrasound of the right breast at the site of palpable
concern was performed demonstrating an oval hypoechoic mass with
margin irregularity at 6 o'clock 5 cm from nipple measuring 0.4 x
0.3 x 0.3 cm. This may be related to patient's prior surgery,
possibly a developing oil cyst, however given this is newly palpable
and demonstrates slight margin irregularity tissue sampling is
warranted. No lymphadenopathy seen in the right axilla.
IMPRESSION: Indeterminate mass in the right breast at the approximate 6 o'clock
location.

RECOMMENDATION:
Ultrasound-guided biopsy of newly palpable mass in the lower right
breast is recommended. This will be scheduled for the patient.

I have discussed the findings and recommendations with the patient.
Results were also provided in writing at the conclusion of the
visit. If applicable, a reminder letter will be sent to the patient
regarding the next appointment.

BI-RADS CATEGORY  4: Suspicious.

## 2020-05-29 ENCOUNTER — Encounter: Payer: Self-pay | Admitting: Surgery

## 2020-05-29 ENCOUNTER — Ambulatory Visit (INDEPENDENT_AMBULATORY_CARE_PROVIDER_SITE_OTHER): Payer: 59 | Admitting: Surgery

## 2020-05-29 ENCOUNTER — Other Ambulatory Visit: Payer: Self-pay

## 2020-05-29 VITALS — BP 133/84 | HR 89 | Temp 98.3°F | Ht 66.0 in | Wt 119.2 lb

## 2020-05-29 DIAGNOSIS — K645 Perianal venous thrombosis: Secondary | ICD-10-CM

## 2020-05-29 MED ORDER — LIDOCAINE-PRILOCAINE 2.5-2.5 % EX CREA: 1.0000 "application " | TOPICAL_CREAM | CUTANEOUS | 0 refills | Status: DC | PRN

## 2020-05-29 NOTE — Progress Notes (Signed)
Outpatient Surgical Follow Up  05/29/2020  Ashley Hancock is an 39 y.o. female.   Chief Complaint  Patient presents with  . New Patient (Initial Visit)    thromb. hemorrhioid     HPI: Ashley Hancock is a 38 year old female well-known to me with a prior history of thrombosed external hemorrhoids that presents today with anorectal pain that is moderate to severe intensity sharp. Worsening when she sits or moves. No fevers no chills some very scant hematochezia. She also feels a lump in the anorectal area. She did have prior examined anesthesia and drainage of thrombosed hemorrhoid. She now has 72 hours of onset of symptoms.  Past Medical History:  Diagnosis Date  . Anxiety   . Blood transfusion without reported diagnosis    1unit post laparotomy 2015  . Dysrhythmia    irreg HR and PVCs  . Heart murmur   . History of gastric ulcer   . IBS (irritable bowel syndrome)   . Migraines   . Motion sickness   . PONV (postoperative nausea and vomiting)    nausea only    Past Surgical History:  Procedure Laterality Date  . APPENDECTOMY    . AUGMENTATION MAMMAPLASTY Bilateral 2018  . BIOPSY N/A 04/05/2019   Procedure: BIOPSY;  Surgeon: Lucilla Lame, MD;  Location: [place];  Service: Endoscopy;  Laterality: N/A;  . CHOLECYSTECTOMY  2011  . COLONOSCOPY WITH PROPOFOL N/A 04/05/2019   Procedure: COLONOSCOPY WITH PROPOFOL;  Surgeon: Lucilla Lame, MD;  Location: [place];  Service: Endoscopy;  Laterality: N/A;  . ESOPHAGOGASTRODUODENOSCOPY (EGD) WITH PROPOFOL N/A 04/05/2019   Procedure: ESOPHAGOGASTRODUODENOSCOPY (EGD) WITH PROPOFOL;  Surgeon: Lucilla Lame, MD;  Location: [place];  Service: Endoscopy;  Laterality: N/A;  . HEMORRHOID SURGERY N/A 12/18/2015   Procedure:  I & D THROMBUS HEMORRHOID;  Surgeon: Jules Husbands, MD;  Location: [facility];  Service: General;  Laterality: N/A;  . LAPAROSCOPY    . LAPAROTOMY     x2  . RHINOPLASTY  2012  . WISDOM TOOTH EXTRACTION       Family History  Problem Relation Age of Onset  . Hypertension Mother   . Thyroid disease Mother   . Bipolar disorder Mother   . Heart disease Father        CHF;MI  . Diabetes Father   . Breast cancer Neg Hx     Social History:  reports that she has been smoking cigarettes. She has a 10.00 pack-year smoking history. She has never used smokeless tobacco. She reports current alcohol use of about 8.0 standard drinks of alcohol per week. She reports that she does not use drugs.  Allergies:  Allergies  Allergen Reactions  . Bacitracin Rash    Blisters    Medications reviewed.    ROS Full ROS performed and is otherwise negative other than what is stated in HPI   BP 133/84   Pulse 89   Temp 98.3 F (36.8 C) (Oral)   Ht 5\' 6"  (1.676 m)   Wt 119 lb 3.2 oz (54.1 kg)   SpO2 97%   BMI 19.24 kg/m   Physical Exam Vitals and nursing note reviewed. Exam conducted with a chaperone present.  Constitutional:      Appearance: Normal appearance. She is normal weight.  Genitourinary:    Comments: There is evidence of a thrombosed external hemorrhoid located on the right lateral position. There is no evidence of perianal sepsis. It is obviously tender to palpation. Skin:  General: Skin is warm and dry.     Capillary Refill: Capillary refill takes less than 2 seconds.  Neurological:     General: No focal deficit present.     Mental Status: She is alert and oriented to person, place, and time.  Psychiatric:        Mood and Affect: Mood normal.        Behavior: Behavior normal.        Thought Content: Thought content normal.        Judgment: Judgment normal.        Assessment/Plan:  1. External hemorrhoid, thrombosed Discussed with patient detail the natural history of thrombosed external hemorrhoids is being 72 hours since the onset of symptoms. Options discussed with the patient for drainage in the office versus drainage in the OR or watchful waiting. She states that  her symptoms have improved and she wants to give the shot for watchful waiting. We will prescribe a lidocaine cream and encourage sitz bath and high-fiber diet. No need for surgical intervention at this time    Greater than 50% of the 25 minutes  visit was spent in counseling/coordination of care   Caroleen Hamman, MD [facility]

## 2020-05-29 NOTE — Patient Instructions (Addendum)
Continue to apply ice pack or cold rag. How to Take a [employer] A sitz bath is a warm water bath that may be used to care for your rectum, genital area, or the area between your rectum and genitals (perineum). For a sitz bath, the water only comes up to your hips and covers your buttocks. A sitz bath may done at home in a bathtub or with a portable sitz bath that fits over the toilet. Your health care provider may recommend a sitz bath to help:  Relieve pain and discomfort after delivering a baby.  Relieve pain and itching from hemorrhoids or anal fissures.  Relieve pain after certain surgeries.  Relax muscles that are sore or tight. How to take a sitz bath Take 3-4 sitz baths a day, or as many as told by your health care provider. Bathtub sitz bath To take a sitz bath in a bathtub: 1. Partially fill a bathtub with warm water. The water should be deep enough to cover your hips and buttocks when you are sitting in the tub. 2. If your health care provider told you to put medicine in the water, follow his or her instructions. 3. Sit in the water. 4. Open the tub drain a little, and leave it open during your bath. 5. Turn on the warm water again, enough to replace the water that is draining out. Keep the water running throughout your bath. This helps keep the water at the right level and the right temperature. 6. Soak in the water for 15-20 minutes, or as long as told by your health care provider. 7. When you are done, be careful when you stand up. You may feel dizzy. 8. After the sitz bath, pat yourself dry. Do not rub your skin to dry it.  Over-the-toilet sitz bath To take a sitz bath with an over-the-toilet basin: 1. Follow the manufacturer's instructions. 2. Fill the basin with warm water. 3. If your health care provider told you to put medicine in the water, follow his or her instructions. 4. Sit on the seat. Make sure the water covers your buttocks and perineum. 5. Soak in the  water for 15-20 minutes, or as long as told by your health care provider. 6. After the sitz bath, pat yourself dry. Do not rub your skin to dry it. 7. Clean and dry the basin between uses. 8. Discard the basin if it cracks, or according to the manufacturer's instructions. Contact a health care provider if:  Your symptoms get worse. Do not continue with sitz baths if your symptoms get worse.  You have new symptoms. If this happens, do not continue with sitz baths until you talk with your health care provider. Summary  A sitz bath is a warm water bath in which the water only comes up to your hips and covers your buttocks.  A sitz bath may help relieve itching, relieve pain, and relax muscles that are sore or tight in the lower part of your body, including your genital area.  Take 3-4 sitz baths a day, or as many as told by your health care provider. Soak in the water for 15-20 minutes.  Do not continue with sitz baths if your symptoms get worse. This information is not intended to replace advice given to you by your health care provider. Make sure you discuss any questions you have with your health care provider. Document Revised: 11/23/2018 Document Reviewed: 06/26/2017 Elsevier Patient Education  [place]. Hemorrhoids Hemorrhoids are swollen  veins that may develop:  In the butt (rectum). These are called internal hemorrhoids.  Around the opening of the butt (anus). These are called external hemorrhoids. Hemorrhoids can cause pain, itching, or bleeding. Most of the time, they do not cause serious problems. They usually get better with diet changes, lifestyle changes, and other home treatments. What are the causes? This condition may be caused by:  Having trouble pooping (constipation).  Pushing hard (straining) to poop.  Watery poop (diarrhea).  Pregnancy.  Being very overweight (obese).  Sitting for long periods of time.  Heavy lifting or other activity that  causes you to strain.  Anal sex.  Riding a bike for a long period of time. What are the signs or symptoms? Symptoms of this condition include:  Pain.  Itching or soreness in the butt.  Bleeding from the butt.  Leaking poop.  Swelling in the area.  One or more lumps around the opening of your butt. How is this diagnosed? A doctor can often diagnose this condition by looking at the affected area. The doctor may also:  Do an exam that involves feeling the area with a gloved hand (digital rectal exam).  Examine the area inside your butt using a small tube (anoscope).  Order blood tests. This may be done if you have lost a lot of blood.  Have you get a test that involves looking inside the colon using a flexible tube with a camera on the end (sigmoidoscopy or colonoscopy). How is this treated? This condition can usually be treated at home. Your doctor may tell you to change what you eat, make lifestyle changes, or try home treatments. If these do not help, procedures can be done to remove the hemorrhoids or make them smaller. These may involve:  Placing rubber bands at the base of the hemorrhoids to cut off their blood supply.  Injecting medicine into the hemorrhoids to shrink them.  Shining a type of light energy onto the hemorrhoids to cause them to fall off.  Doing surgery to remove the hemorrhoids or cut off their blood supply. Follow these instructions at home: Eating and drinking   Eat foods that have a lot of fiber in them. These include whole grains, beans, nuts, fruits, and vegetables.  Ask your doctor about taking products that have added fiber (fibersupplements).  Reduce the amount of fat in your diet. You can do this by: ? Eating low-fat dairy products. ? Eating less red meat. ? Avoiding processed foods.  Drink enough fluid to keep your pee (urine) pale yellow. Managing pain and swelling   Take a warm-water bath (sitz bath) for 20 minutes to ease pain.  Do this 3-4 times a day. You may do this in a bathtub or using a portable sitz bath that fits over the toilet.  If told, put ice on the painful area. It may be helpful to use ice between your warm baths. ? Put ice in a plastic bag. ? Place a towel between your skin and the bag. ? Leave the ice on for 20 minutes, 2-3 times a day. General instructions  Take over-the-counter and prescription medicines only as told by your doctor. ? Medicated creams and medicines may be used as told.  Exercise often. Ask your doctor how much and what kind of exercise is best for you.  Go to the bathroom when you have the urge to poop. Do not wait.  Avoid pushing too hard when you poop.  Keep your butt dry and  clean. Use wet toilet paper or moist towelettes after pooping.  Do not sit on the toilet for a long time.  Keep all follow-up visits as told by your doctor. This is important. Contact a doctor if you:  Have pain and swelling that do not get better with treatment or medicine.  Have trouble pooping.  Cannot poop.  Have pain or swelling outside the area of the hemorrhoids. Get help right away if you have:  Bleeding that will not stop. Summary  Hemorrhoids are swollen veins in the butt or around the opening of the butt.  They can cause pain, itching, or bleeding.  Eat foods that have a lot of fiber in them. These include whole grains, beans, nuts, fruits, and vegetables.  Take a warm-water bath (sitz bath) for 20 minutes to ease pain. Do this 3-4 times a day. This information is not intended to replace advice given to you by your health care provider. Make sure you discuss any questions you have with your health care provider. Document Revised: 07/02/2018 Document Reviewed: 11/13/2017 Elsevier Patient Education  [place].

## 2020-12-20 ENCOUNTER — Other Ambulatory Visit (HOSPITAL_COMMUNITY): Payer: Self-pay

## 2020-12-21 ENCOUNTER — Other Ambulatory Visit (HOSPITAL_COMMUNITY): Payer: Self-pay

## 2020-12-22 ENCOUNTER — Ambulatory Visit: Payer: Self-pay | Attending: Internal Medicine | Admitting: Pharmacist

## 2020-12-22 ENCOUNTER — Other Ambulatory Visit: Payer: Self-pay | Admitting: Pharmacist

## 2020-12-22 ENCOUNTER — Other Ambulatory Visit: Payer: Self-pay

## 2020-12-22 ENCOUNTER — Other Ambulatory Visit (HOSPITAL_COMMUNITY): Payer: Self-pay

## 2020-12-22 DIAGNOSIS — Z79899 Other long term (current) drug therapy: Secondary | ICD-10-CM

## 2020-12-22 MED ORDER — DUPILUMAB 300 MG/2ML ~~LOC~~ SOSY
PREFILLED_SYRINGE | SUBCUTANEOUS | 0 refills | Status: DC
  Filled 2020-12-22: qty 4, fill #0
  Filled 2020-12-26: qty 4, 28d supply, fill #0
  Filled 2020-12-28 – 2021-01-02 (×2): qty 4, fill #0
  Filled 2021-01-02 – 2021-01-05 (×4): qty 4, 28d supply, fill #0

## 2020-12-22 MED ORDER — DUPILUMAB 300 MG/2ML ~~LOC~~ SOSY: PREFILLED_SYRINGE | SUBCUTANEOUS | 0 refills | Status: DC

## 2020-12-22 NOTE — Progress Notes (Signed)
   S: Patient presents for review of their specialty medication therapy.  Patient is currently taking Dupixent for atopic dermatitis. Patient is managed by Dr. Fontaine No for this.   Adherence: denies any missed doses  Efficacy: reports that it is working well and denies any adverse effects  Dosing: 300 mg every 2 weeks  Drug-drug interactions: none  Monitoring: Local injection site reactions: denies Hypersensitivity: denies Eosinophilia: denies Ocular effects: denies S/sx of parasitic infections: denies  O:     Lab Results  Component Value Date   WBC 7.3 03/28/2019   HGB 14.4 03/28/2019   HCT 41.9 03/28/2019   MCV 94.8 03/28/2019   PLT 292 03/28/2019      Chemistry      Component Value Date/Time   NA 133 (L) 03/28/2019 2250   NA 139 03/01/2014 1131   K 3.4 (L) 03/28/2019 2250   K 4.0 03/01/2014 1131   CL 103 03/28/2019 2250   CL 108 (H) 03/01/2014 1131   CO2 21 (L) 03/28/2019 2250   CO2 23 03/01/2014 1131   BUN 9 03/28/2019 2250   BUN 5 (L) 03/01/2014 1131   CREATININE 0.62 03/28/2019 2250   CREATININE 0.60 03/01/2014 1131      Component Value Date/Time   CALCIUM 9.0 03/28/2019 2250   CALCIUM 9.0 03/01/2014 1131   ALKPHOS 39 03/28/2019 2250   ALKPHOS 81 05/22/2013 2040   AST 18 03/28/2019 2250   AST 40 (H) 05/22/2013 2040   ALT 15 03/28/2019 2250   ALT 84 (H) 05/22/2013 2040   BILITOT 0.4 03/28/2019 2250   BILITOT 0.3 05/22/2013 2040       A/P: 1. Medication review: Patient currently on [place] for atopic dermatitis and is tolerating it well with no adverse effects. Reviewed the medication, including the following: Dupixent is a monoclonal antibody used to decrease inflammation. Possible adverse effects include injection site reactions, ocular adverse effects, eosinophilia, vasculitis, and increased risk of parasitic infections. No recommendations for any changes.   Benard Halsted, PharmD, [facility], [place] [phone]

## 2020-12-23 ENCOUNTER — Other Ambulatory Visit (HOSPITAL_COMMUNITY): Payer: Self-pay

## 2020-12-25 ENCOUNTER — Other Ambulatory Visit (HOSPITAL_COMMUNITY): Payer: Self-pay

## 2020-12-26 ENCOUNTER — Other Ambulatory Visit (HOSPITAL_COMMUNITY): Payer: Self-pay

## 2020-12-27 ENCOUNTER — Other Ambulatory Visit (HOSPITAL_COMMUNITY): Payer: Self-pay

## 2020-12-28 ENCOUNTER — Other Ambulatory Visit: Payer: Self-pay

## 2020-12-28 ENCOUNTER — Other Ambulatory Visit (HOSPITAL_COMMUNITY): Payer: Self-pay

## 2020-12-28 DIAGNOSIS — L2089 Other atopic dermatitis: Secondary | ICD-10-CM | POA: Diagnosis not present

## 2020-12-28 DIAGNOSIS — Z79899 Other long term (current) drug therapy: Secondary | ICD-10-CM | POA: Diagnosis not present

## 2020-12-28 DIAGNOSIS — L305 Pityriasis alba: Secondary | ICD-10-CM | POA: Diagnosis not present

## 2020-12-28 MED ORDER — BETAMETHASONE DIPROPIONATE AUG 0.05 % EX OINT
TOPICAL_OINTMENT | Freq: Two times a day (BID) | CUTANEOUS | 2 refills | Status: DC
  Filled 2020-12-28: qty 50, 30d supply, fill #0

## 2020-12-28 MED ORDER — DUPIXENT 300 MG/2ML ~~LOC~~ SOSY: PREFILLED_SYRINGE | SUBCUTANEOUS | 6 refills | Status: DC

## 2020-12-29 ENCOUNTER — Other Ambulatory Visit: Payer: Self-pay

## 2021-01-01 ENCOUNTER — Other Ambulatory Visit: Payer: Self-pay

## 2021-01-01 MED ORDER — NURTEC 75 MG PO TBDP
ORAL_TABLET | ORAL | 0 refills | Status: DC
  Filled 2021-01-01: qty 8, 30d supply, fill #0

## 2021-01-02 ENCOUNTER — Other Ambulatory Visit (HOSPITAL_COMMUNITY): Payer: Self-pay

## 2021-01-05 ENCOUNTER — Other Ambulatory Visit (HOSPITAL_COMMUNITY): Payer: Self-pay

## 2021-01-05 ENCOUNTER — Other Ambulatory Visit: Payer: Self-pay

## 2021-01-09 ENCOUNTER — Other Ambulatory Visit (HOSPITAL_COMMUNITY): Payer: Self-pay

## 2021-01-29 ENCOUNTER — Other Ambulatory Visit: Payer: Self-pay

## 2021-01-29 MED ORDER — AMPHETAMINE-DEXTROAMPHETAMINE 15 MG PO TABS
ORAL_TABLET | ORAL | 0 refills | Status: DC
  Filled 2021-01-29: qty 60, 30d supply, fill #0

## 2021-02-01 ENCOUNTER — Other Ambulatory Visit: Payer: Self-pay

## 2021-02-01 ENCOUNTER — Other Ambulatory Visit (HOSPITAL_COMMUNITY): Payer: Self-pay

## 2021-02-01 ENCOUNTER — Other Ambulatory Visit: Payer: Self-pay | Admitting: Pharmacist

## 2021-02-01 MED ORDER — DUPILUMAB 300 MG/2ML ~~LOC~~ SOSY
PREFILLED_SYRINGE | SUBCUTANEOUS | 6 refills | Status: DC
  Filled 2021-02-01 – 2021-02-02 (×2): qty 4, fill #0
  Filled 2021-02-07: qty 4, 28d supply, fill #0
  Filled 2021-02-28 – 2021-03-02 (×2): qty 4, 28d supply, fill #1
  Filled 2021-03-27: qty 4, 28d supply, fill #2
  Filled 2021-04-30: qty 4, 28d supply, fill #3
  Filled 2021-05-24: qty 4, 28d supply, fill #4
  Filled 2021-06-19 – 2021-06-26 (×2): qty 4, 28d supply, fill #5
  Filled 2021-07-23: qty 4, 28d supply, fill #6

## 2021-02-02 ENCOUNTER — Other Ambulatory Visit (HOSPITAL_COMMUNITY): Payer: Self-pay

## 2021-02-05 ENCOUNTER — Other Ambulatory Visit (HOSPITAL_COMMUNITY): Payer: Self-pay

## 2021-02-07 ENCOUNTER — Other Ambulatory Visit (HOSPITAL_COMMUNITY): Payer: Self-pay

## 2021-02-08 ENCOUNTER — Other Ambulatory Visit: Payer: Self-pay

## 2021-02-09 ENCOUNTER — Other Ambulatory Visit: Payer: Self-pay

## 2021-02-09 MED ORDER — NURTEC 75 MG PO TBDP
ORAL_TABLET | ORAL | 5 refills | Status: DC
  Filled 2021-02-09: qty 8, 30d supply, fill #0
  Filled 2021-03-27: qty 8, 30d supply, fill #1
  Filled 2021-04-30: qty 8, 30d supply, fill #2
  Filled 2021-06-04: qty 8, 30d supply, fill #3
  Filled 2021-07-04: qty 8, 30d supply, fill #4
  Filled 2021-08-02: qty 8, 30d supply, fill #5

## 2021-02-14 ENCOUNTER — Telehealth: Payer: Self-pay | Admitting: Physician Assistant

## 2021-02-14 DIAGNOSIS — H109 Unspecified conjunctivitis: Secondary | ICD-10-CM

## 2021-02-14 MED ORDER — POLYMYXIN B-TRIMETHOPRIM 10000-0.1 UNIT/ML-% OP SOLN
1.0000 [drp] | OPHTHALMIC | 0 refills | Status: DC
  Filled 2021-02-14: qty 10, 14d supply, fill #0

## 2021-02-14 NOTE — Progress Notes (Signed)

## 2021-02-15 ENCOUNTER — Other Ambulatory Visit: Payer: Self-pay

## 2021-02-21 ENCOUNTER — Telehealth: Payer: 59 | Admitting: Physician Assistant

## 2021-02-21 DIAGNOSIS — J4521 Mild intermittent asthma with (acute) exacerbation: Secondary | ICD-10-CM | POA: Diagnosis not present

## 2021-02-21 DIAGNOSIS — J069 Acute upper respiratory infection, unspecified: Secondary | ICD-10-CM | POA: Diagnosis not present

## 2021-02-21 MED ORDER — FLUTICASONE PROPIONATE HFA 110 MCG/ACT IN AERO: 1.0000 | INHALATION_SPRAY | Freq: Two times a day (BID) | RESPIRATORY_TRACT | 12 refills | Status: DC

## 2021-02-21 MED ORDER — BENZONATATE 100 MG PO CAPS: 100.0000 mg | ORAL_CAPSULE | Freq: Three times a day (TID) | ORAL | 0 refills | Status: DC | PRN

## 2021-02-21 MED ORDER — ALBUTEROL SULFATE HFA 108 (90 BASE) MCG/ACT IN AERS: 2.0000 | INHALATION_SPRAY | Freq: Four times a day (QID) | RESPIRATORY_TRACT | 0 refills | Status: DC | PRN

## 2021-02-21 NOTE — Progress Notes (Signed)
I have spent 5 minutes in review of e-visit questionnaire, review and updating patient chart, medical decision making and response to patient.   Cherry Wittwer [place] Ruthie Berch, PA-C    

## 2021-02-21 NOTE — Progress Notes (Signed)
E-Visit for Upper Respiratory Infection   We are sorry you are not feeling well.  Here is how we plan to help!  Based on what you have shared with me, it looks like you may have a viral upper respiratory infection.  Upper respiratory infections are caused by a large number of viruses; however, rhinovirus is the most common cause.   Symptoms vary from person to person, with common symptoms including sore throat, cough, fatigue or lack of energy and feeling of general discomfort.  A low-grade fever of up to 100.4 may present, but is often uncommon.  Symptoms vary however, and are closely related to a person's age or underlying illnesses.  The most common symptoms associated with an upper respiratory infection are nasal discharge or congestion, cough, sneezing, headache and pressure in the ears and face.  These symptoms usually persist for about 3 to 10 days, but can last up to 2 weeks.  It is important to know that upper respiratory infections do not cause serious illness or complications in most cases.    Upper respiratory infections can be transmitted from person to person, with the most common method of transmission being a person's hands.  The virus is able to live on the skin and can infect other persons for up to 2 hours after direct contact.  Also, these can be transmitted when someone coughs or sneezes; thus, it is important to cover the mouth to reduce this risk.  To keep the spread of the illness at [place], good hand hygiene is very important.  This is an infection that is most likely caused by a virus. There are no specific treatments other than to help you with the symptoms until the infection runs its course.  We are sorry you are not feeling well.  Here is how we plan to help!   For nasal congestion, you may use an oral decongestants such as Mucinex D or if you have glaucoma or high blood pressure use plain Mucinex.  Saline nasal spray or nasal drops can help and can safely be used as often as  needed for congestion.   If you do not have a history of heart disease, hypertension, diabetes or thyroid disease, prostate/bladder issues or glaucoma, you may also use Sudafed to treat nasal congestion.  It is highly recommended that you consult with a pharmacist or your primary care physician to ensure this medication is safe for you to take.     If you have a cough, you may use cough suppressants such as Delsym and Robitussin.  If you have glaucoma or high blood pressure, you can also use Coricidin HBP.   For cough I have prescribed for you A prescription cough medication called Tessalon Perles 100 mg. You may take 1-2 capsules every 8 hours as needed for cough  For the chest tightness and wheezing, we need to avoid oral (systemic) steroids if possible as you are currently on Dupixent from your primary care provider which already blunts your immune system and the steroids will do that further. I have sent in a new prescription for your rescue inhaler (albuterol) as well as an inhaled steroid inhaler.   If you have a sore or scratchy throat, use a saltwater gargle-  to  teaspoon of salt dissolved in a 4-ounce to 8-ounce glass of warm water.  Gargle the solution for approximately 15-30 seconds and then spit.  It is important not to swallow the solution.  You can also use throat lozenges/cough drops  and Chloraseptic spray to help with throat pain or discomfort.  Warm or cold liquids can also be helpful in relieving throat pain.  For headache, pain or general discomfort, you can use Ibuprofen or Tylenol as directed.   Some authorities believe that zinc sprays or the use of Echinacea may shorten the course of your symptoms.   HOME CARE Only take medications as instructed by your medical team. Be sure to drink plenty of fluids. Water is fine as well as fruit juices, sodas and electrolyte beverages. You may want to stay away from caffeine or alcohol. If you are nauseated, try taking small sips of  liquids. How do you know if you are getting enough fluid? Your urine should be a pale yellow or almost colorless. Get rest. Taking a steamy shower or using a humidifier may help nasal congestion and ease sore throat pain. You can place a towel over your head and breathe in the steam from hot water coming from a faucet. Using a saline nasal spray works much the same way. Cough drops, hard candies and sore throat lozenges may ease your cough. Avoid close contacts especially the very young and the elderly Cover your mouth if you cough or sneeze Always remember to wash your hands.   GET HELP RIGHT AWAY IF: You develop worsening fever. If your symptoms do not improve within 10 days You develop yellow or green discharge from your nose over 3 days. You have coughing fits You develop a severe head ache or visual changes. You develop shortness of breath, difficulty breathing or start having chest pain Your symptoms persist after you have completed your treatment plan  MAKE SURE YOU  Understand these instructions. Will watch your condition. Will get help right away if you are not doing well or get worse.  Thank you for choosing an e-visit.  Your e-visit answers were reviewed by a board certified advanced clinical practitioner to complete your personal care plan. Depending upon the condition, your plan could have included both over the counter or prescription medications.  Please review your pharmacy choice. Make sure the pharmacy is open so you can pick up prescription now. If there is a problem, you may contact your provider through [employer] and have the prescription routed to another pharmacy.  Your safety is important to [place]. If you have drug allergies check your prescription carefully.   For the next 24 hours you can use MyChart to ask questions about today's visit, request a non-urgent call back, or ask for a work or school excuse. You will get an email in the next two days asking  about your experience. I hope that your e-visit has been valuable and will speed your recovery.

## 2021-02-23 ENCOUNTER — Ambulatory Visit
Admission: RE | Admit: 2021-02-23 | Discharge: 2021-02-23 | Disposition: A | Discharge status: Home / Self Care | Payer: 59 | Source: Ambulatory Visit | Attending: Emergency Medicine | Admitting: Emergency Medicine

## 2021-02-23 ENCOUNTER — Other Ambulatory Visit: Payer: Self-pay

## 2021-02-23 ENCOUNTER — Ambulatory Visit (INDEPENDENT_AMBULATORY_CARE_PROVIDER_SITE_OTHER): Payer: 59

## 2021-02-23 VITALS — BP 152/89 | HR 98 | Temp 97.9°F | Resp 24

## 2021-02-23 DIAGNOSIS — J209 Acute bronchitis, unspecified: Secondary | ICD-10-CM

## 2021-02-23 DIAGNOSIS — R059 Cough, unspecified: Secondary | ICD-10-CM | POA: Diagnosis not present

## 2021-02-23 MED ORDER — AZITHROMYCIN 250 MG PO TABS: 250.0000 mg | ORAL_TABLET | Freq: Every day | ORAL | 0 refills | Status: DC

## 2021-02-23 MED ORDER — PREDNISONE 10 MG PO TABS: 40.0000 mg | ORAL_TABLET | Freq: Every day | ORAL | 0 refills | Status: AC

## 2021-02-23 MED ORDER — PROMETHAZINE-DM 6.25-15 MG/5ML PO SYRP: 5.0000 mL | ORAL_SOLUTION | Freq: Every evening | ORAL | 0 refills | Status: DC | PRN

## 2021-02-23 NOTE — Discharge Instructions (Addendum)
Your chest x-ray is normal.    Continue using the albuterol inhaler.  Start the prednisone and Zithromax today.    Take the promethazine-dextromethorphan cough syrup as needed for cough.  Do not drive, operate machinery, or drink alcohol with this medication as it may cause drowsiness.    Follow-up with your primary care provider next week.

## 2021-02-23 NOTE — ED Provider Notes (Signed)
Roderic Palau    CSN: [id number] Arrival date & time: 02/23/21  1621      History   Chief Complaint Chief Complaint  Patient presents with   Cough   Nasal Congestion     HPI Ashley Hancock is a 40 y.o. female.  Patient presents with 1 week history of chest congestion, cough, shortness of breath.  She denies fever, chills, sore throat, vomiting, diarrhea, or other symptoms.  Patient had an E-visit with [facility] on 02/21/2021; diagnosed with viral URI with cough, asthma exacerbation; treated with Tessalon [place].  She has also been using an albuterol inhaler.  She also had an E-visit with [facility] on 02/14/2021; diagnosed with conjunctivitis; treated with Polytrim eyedrops.  Her medical history also includes IBS, migraines, anxiety.  Current every day smoker.  Patient reports 2 negative at-home COVID tests.  The history is provided by the patient and medical records.   Past Medical History:  Diagnosis Date   Anxiety    Blood transfusion without reported diagnosis    1unit post laparotomy 2015   Dysrhythmia    irreg HR and PVCs   Heart murmur    History of gastric ulcer    IBS (irritable bowel syndrome)    Migraines    Motion sickness    PONV (postoperative nausea and vomiting)    nausea only    Patient Active Problem List   Diagnosis Date Noted   Mass of breast, left 03/04/2018   Pregnancy 05/02/2015   General medical exam 09/09/2012   Migraine 09/09/2012    Past Surgical History:  Procedure Laterality Date   APPENDECTOMY     AUGMENTATION MAMMAPLASTY Bilateral 2018   BIOPSY N/A 04/05/2019   Procedure: BIOPSY;  Surgeon: Lucilla Lame, MD;  Location: [facility];  Service: Endoscopy;  Laterality: N/A;   CHOLECYSTECTOMY  2011   COLONOSCOPY WITH PROPOFOL N/A 04/05/2019   Procedure: COLONOSCOPY WITH PROPOFOL;  Surgeon: Lucilla Lame, MD;  Location: [facility];  Service: Endoscopy;  Laterality: N/A;   ESOPHAGOGASTRODUODENOSCOPY (EGD) WITH  PROPOFOL N/A 04/05/2019   Procedure: ESOPHAGOGASTRODUODENOSCOPY (EGD) WITH PROPOFOL;  Surgeon: Lucilla Lame, MD;  Location: [facility];  Service: Endoscopy;  Laterality: N/A;   HEMORRHOID SURGERY N/A 12/18/2015   Procedure:  I & D THROMBUS HEMORRHOID;  Surgeon: Jules Husbands, MD;  Location: [facility];  Service: General;  Laterality: N/A;   LAPAROSCOPY     LAPAROTOMY     x2   RHINOPLASTY  2012   WISDOM TOOTH EXTRACTION      OB History     Gravida  2   Para  2   Term  2   Preterm      AB      Living  2      SAB      IAB      Ectopic      Multiple  0   Live Births  2            Home Medications    Prior to Admission medications   Medication Sig Start Date End Date Taking? Authorizing Provider  azithromycin (ZITHROMAX) 250 MG tablet Take 1 tablet (250 mg total) by mouth daily. Take first 2 tablets together, then 1 every day until finished. 02/23/21  Yes Sharion Balloon, NP  predniSONE (DELTASONE) 10 MG tablet Take 4 tablets (40 mg total) by mouth daily for 5 days. 02/23/21 02/28/21 Yes Sharion Balloon, NP  promethazine-dextromethorphan (PROMETHAZINE-DM) 6.25-15  MG/5ML syrup Take 5 mLs by mouth at bedtime as needed for cough. 02/23/21  Yes Sharion Balloon, NP  albuterol (VENTOLIN HFA) 108 (90 Base) MCG/ACT inhaler Inhale 2 puffs into the lungs every 6 (six) hours as needed for wheezing or shortness of breath. 02/21/21   Brunetta Jeans, PA-C  amphetamine-dextroamphetamine (ADDERALL) 15 MG tablet Take 15 mg by mouth 2 (two) times daily.    [provider]  amphetamine-dextroamphetamine (ADDERALL) 15 MG tablet 1 tablet Orally Twice a day 30 days 01/29/21     augmented betamethasone dipropionate (DIPROLENE-AF) 0.05 % ointment Apply 1 application on the skin twice a day 12/28/20     benzonatate (TESSALON) 100 MG capsule Take 1 capsule (100 mg total) by mouth 3 (three) times daily as needed for cough. 02/21/21   Brunetta Jeans, PA-C   butalbital-acetaminophen-caffeine (FIORICET) [phone] MG tablet Take 1 tablet by mouth every 4 (four) hours as needed for headache.    [provider]  cyclobenzaprine (FLEXERIL) 5 MG tablet Take 5 mg by mouth 3 (three) times daily as needed for muscle spasms.    [provider]  dupilumab (DUPIXENT) 300 MG/2ML prefilled syringe Inject 300 mg into the skin every other week 02/01/21   Tresa Garter, MD  fluticasone (FLONASE) 50 MCG/ACT nasal spray Place 2 sprays into both nostrils daily. 09/23/18   Evelina Dun A, FNP  fluticasone (FLOVENT HFA) 110 MCG/ACT inhaler Inhale 1 puff into the lungs in the morning and at bedtime. 02/21/21   Brunetta Jeans, PA-C  ibuprofen (ADVIL) 200 MG tablet Take 200 mg by mouth every 6 (six) hours as needed for headache.    [provider]  levonorgestrel (MIRENA) 20 MCG/24HR IUD 1 each by Intrauterine route once.    [provider]  lidocaine-prilocaine (EMLA) cream Apply 1 application topically as needed. 05/29/20   Pabon, Diego F, MD  Rimegepant Sulfate (NURTEC) 75 MG TBDP Dissolve 1 tablet on the tongue and allow to dissolve as needed once a day. 02/09/21     rizatriptan (MAXALT) 10 MG tablet Take 10 mg by mouth as needed for migraine. May repeat in 2 hours if needed    [provider]  trimethoprim-polymyxin b (POLYTRIM) ophthalmic solution Place 1 drop into the right eye every 4 (four) hours. 02/14/21   Mar Daring, PA-C    Family History Family History  Problem Relation Age of Onset   Hypertension Mother    Thyroid disease Mother    Bipolar disorder Mother    Heart disease Father        CHF;MI   Diabetes Father    Breast cancer Neg Hx     Social History Social History   Tobacco Use   Smoking status: Every Day    Packs/day: 0.50    Years: 20.00    Pack years: 10.00    Types: Cigarettes   Smokeless tobacco: Never   Tobacco comments:    started age 89  Vaping Use   Vaping Use: Some  days   Substances: Nicotine   Devices: Juul  Substance Use Topics   Alcohol use: Yes    Alcohol/week: 8.0 standard drinks    Types: 8 Glasses of wine per week   Drug use: No     Allergies   Bacitracin   Review of Systems Review of Systems  Constitutional:  Negative for chills and fever.  HENT:  Positive for congestion. Negative for ear pain and sore throat.   Respiratory:  Positive for cough and shortness of breath.   Cardiovascular:  Negative for chest pain and palpitations.  Gastrointestinal:  Negative for abdominal pain and vomiting.  Skin:  Negative for color change and rash.  All other systems reviewed and are negative.   Physical Exam Triage Vital Signs ED Triage Vitals  Enc Vitals Group     BP      Pulse      Resp      Temp      Temp src      SpO2      Weight      Height      Head Circumference      Peak Flow      Pain Score      Pain Loc      Pain Edu?      Excl. in [place]?    No data found.  Updated Vital Signs BP (!) 152/89   Pulse 98   Temp 97.9 F (36.6 C) (Bladder)   Resp (!) 24   LMP  (LMP Unknown) Comment: IUD, no period for years  SpO2 96%   Visual Acuity Right Eye Distance:   Left Eye Distance:   Bilateral Distance:    Right Eye Near:   Left Eye Near:    Bilateral Near:     Physical Exam Vitals and nursing note reviewed.  Constitutional:      General: She is not in acute distress.    Appearance: She is well-developed.  HENT:     Head: Normocephalic and atraumatic.     Right Ear: Tympanic membrane normal.     Left Ear: Tympanic membrane normal.     Nose: Nose normal.     Mouth/Throat:     Mouth: Mucous membranes are moist.     Pharynx: Oropharynx is clear.  Eyes:     Conjunctiva/sclera: Conjunctivae normal.  Cardiovascular:     Rate and Rhythm: Normal rate and regular rhythm.     Heart sounds: Normal heart sounds.  Pulmonary:     Effort: Pulmonary effort is normal. No respiratory distress.     Breath sounds: Wheezing and  rhonchi present.  Abdominal:     Palpations: Abdomen is soft.     Tenderness: There is no abdominal tenderness.  Musculoskeletal:     Cervical back: Neck supple.  Skin:    General: Skin is warm and dry.  Neurological:     General: No focal deficit present.     Mental Status: She is alert and oriented to person, place, and time.     Gait: Gait normal.  Psychiatric:        Mood and Affect: Mood normal.        Behavior: Behavior normal.     UC Treatments / Results  Labs (all labs ordered are listed, but only abnormal results are displayed) Labs Reviewed - No data to display  EKG   Radiology DG Chest 2 View  Result Date: 02/23/2021 CLINICAL DATA:  Prolonged cough EXAM: CHEST - 2 VIEW COMPARISON:  None. FINDINGS: The heart size and mediastinal contours are within normal limits. Both lungs are clear. Scoliosis of the spine. IMPRESSION: No active cardiopulmonary disease. Electronically Signed   By: Donavan Foil M.D.   On: 02/23/2021 17:37    Procedures Procedures (including critical care time)  Medications Ordered in UC Medications - No data to display  Initial Impression / Assessment and Plan / UC Course  I have reviewed the triage vital signs  and the nursing notes.  Pertinent labs & imaging results that were available during my care of the patient were reviewed by me and considered in my medical decision making (see chart for details).  Acute bronchitis.  Patient declines COVID test.  Chest x-ray negative.  Treating with continued use of albuterol inhaler, prednisone burst, Zithromax.  Patient reports her cough is keeping her awake at night; treating with promethazine-dextromethorphan cough medication; precautions for drowsiness with this medication discussed.  Instructed patient to follow-up with her PCP next week.  ED precautions discussed.  Patient agrees to plan of care.   Final Clinical Impressions(s) / UC Diagnoses   Final diagnoses:  Acute bronchitis, unspecified  organism     Discharge Instructions      Your chest x-ray is normal.    Continue using the albuterol inhaler.  Start the prednisone and Zithromax today.    Take the promethazine-dextromethorphan cough syrup as needed for cough.  Do not drive, operate machinery, or drink alcohol with this medication as it may cause drowsiness.    Follow-up with your primary care provider next week.         ED Prescriptions     Medication Sig Dispense Auth. Provider   predniSONE (DELTASONE) 10 MG tablet Take 4 tablets (40 mg total) by mouth daily for 5 days. 20 tablet Sharion Balloon, NP   azithromycin (ZITHROMAX) 250 MG tablet Take 1 tablet (250 mg total) by mouth daily. Take first 2 tablets together, then 1 every day until finished. 6 tablet Sharion Balloon, NP   promethazine-dextromethorphan (PROMETHAZINE-DM) 6.25-15 MG/5ML syrup Take 5 mLs by mouth at bedtime as needed for cough. 30 mL Sharion Balloon, NP      I have reviewed the PDMP during this encounter.   Sharion Balloon, NP 02/23/21 1752

## 2021-02-23 NOTE — ED Triage Notes (Signed)
Pt here with cough and congestion x 1 week. Was prescribed inhaler, tessalon, and has been taking mucinex with no relief. Pt has chest tightness from congestion and cannot cough it up. Kindly requests chest x ray.

## 2021-03-01 ENCOUNTER — Other Ambulatory Visit (HOSPITAL_COMMUNITY): Payer: Self-pay

## 2021-03-02 ENCOUNTER — Other Ambulatory Visit (HOSPITAL_COMMUNITY): Payer: Self-pay

## 2021-03-05 ENCOUNTER — Other Ambulatory Visit (HOSPITAL_COMMUNITY): Payer: Self-pay

## 2021-03-05 DIAGNOSIS — R41840 Attention and concentration deficit: Secondary | ICD-10-CM | POA: Diagnosis not present

## 2021-03-06 ENCOUNTER — Other Ambulatory Visit: Payer: Self-pay

## 2021-03-06 MED ORDER — AMPHETAMINE-DEXTROAMPHETAMINE 15 MG PO TABS
ORAL_TABLET | ORAL | 0 refills | Status: DC
  Filled 2021-03-06: qty 60, 30d supply, fill #0

## 2021-03-27 ENCOUNTER — Other Ambulatory Visit: Payer: Self-pay

## 2021-03-27 ENCOUNTER — Other Ambulatory Visit (HOSPITAL_COMMUNITY): Payer: Self-pay

## 2021-03-27 DIAGNOSIS — R41840 Attention and concentration deficit: Secondary | ICD-10-CM | POA: Diagnosis not present

## 2021-04-02 ENCOUNTER — Other Ambulatory Visit (HOSPITAL_COMMUNITY): Payer: Self-pay

## 2021-04-03 ENCOUNTER — Other Ambulatory Visit: Payer: Self-pay

## 2021-04-03 MED ORDER — AMPHETAMINE-DEXTROAMPHETAMINE 15 MG PO TABS
ORAL_TABLET | ORAL | 0 refills | Status: DC
  Filled 2021-04-03: qty 60, 30d supply, fill #0

## 2021-04-03 MED ORDER — BUTALBITAL-APAP-CAFFEINE 50-300-40 MG PO CAPS
ORAL_CAPSULE | ORAL | 2 refills | Status: DC
  Filled 2021-04-03: qty 90, 30d supply, fill #0
  Filled 2021-04-03: qty 90, 15d supply, fill #0

## 2021-04-03 MED ORDER — ALBUTEROL SULFATE HFA 108 (90 BASE) MCG/ACT IN AERS
INHALATION_SPRAY | RESPIRATORY_TRACT | 2 refills | Status: DC
  Filled 2021-04-03: qty 18, 30d supply, fill #0
  Filled 2021-12-14: qty 18, 30d supply, fill #1
  Filled 2022-03-21: qty 18, 30d supply, fill #2

## 2021-04-04 ENCOUNTER — Other Ambulatory Visit: Payer: Self-pay

## 2021-04-04 MED ORDER — BUTALBITAL-APAP-CAFFEINE 50-325-40 MG PO TABS
ORAL_TABLET | ORAL | 2 refills | Status: DC
  Filled 2021-04-04: qty 90, 30d supply, fill #0
  Filled 2021-08-23: qty 90, 30d supply, fill #1
  Filled 2021-09-25: qty 90, 30d supply, fill #2

## 2021-04-18 ENCOUNTER — Other Ambulatory Visit (HOSPITAL_COMMUNITY): Payer: Self-pay

## 2021-04-30 ENCOUNTER — Other Ambulatory Visit (HOSPITAL_COMMUNITY): Payer: Self-pay

## 2021-05-16 ENCOUNTER — Other Ambulatory Visit: Payer: Self-pay

## 2021-05-16 MED ORDER — AMPHETAMINE-DEXTROAMPHETAMINE 15 MG PO TABS
ORAL_TABLET | ORAL | 0 refills | Status: DC
  Filled 2021-05-16: qty 60, 30d supply, fill #0

## 2021-05-24 ENCOUNTER — Other Ambulatory Visit (HOSPITAL_COMMUNITY): Payer: Self-pay

## 2021-06-04 ENCOUNTER — Other Ambulatory Visit: Payer: Self-pay

## 2021-06-05 DIAGNOSIS — R102 Pelvic and perineal pain: Secondary | ICD-10-CM | POA: Diagnosis not present

## 2021-06-13 ENCOUNTER — Other Ambulatory Visit: Payer: Self-pay

## 2021-06-13 DIAGNOSIS — R41840 Attention and concentration deficit: Secondary | ICD-10-CM | POA: Diagnosis not present

## 2021-06-13 MED ORDER — AMPHETAMINE-DEXTROAMPHETAMINE 15 MG PO TABS
ORAL_TABLET | ORAL | 0 refills | Status: DC
  Filled 2021-06-13: qty 60, 30d supply, fill #0

## 2021-06-14 ENCOUNTER — Other Ambulatory Visit: Payer: Self-pay

## 2021-06-19 ENCOUNTER — Other Ambulatory Visit: Payer: Self-pay

## 2021-06-19 ENCOUNTER — Other Ambulatory Visit (HOSPITAL_COMMUNITY): Payer: Self-pay

## 2021-06-19 DIAGNOSIS — Z79899 Other long term (current) drug therapy: Secondary | ICD-10-CM | POA: Diagnosis not present

## 2021-06-19 DIAGNOSIS — L2089 Other atopic dermatitis: Secondary | ICD-10-CM | POA: Diagnosis not present

## 2021-06-19 MED ORDER — BETAMETHASONE DIPROPIONATE AUG 0.05 % EX CREA
TOPICAL_CREAM | CUTANEOUS | 1 refills | Status: DC
  Filled 2021-06-19: qty 50, 30d supply, fill #0
  Filled 2022-06-03: qty 50, 30d supply, fill #1

## 2021-06-20 ENCOUNTER — Other Ambulatory Visit (HOSPITAL_COMMUNITY): Payer: Self-pay

## 2021-06-20 MED ORDER — DUPIXENT 300 MG/2ML ~~LOC~~ SOSY
PREFILLED_SYRINGE | SUBCUTANEOUS | 6 refills | Status: DC
  Filled 2021-06-20: qty 2, 14d supply, fill #0

## 2021-06-21 DIAGNOSIS — Z3202 Encounter for pregnancy test, result negative: Secondary | ICD-10-CM | POA: Diagnosis not present

## 2021-06-21 DIAGNOSIS — Z30433 Encounter for removal and reinsertion of intrauterine contraceptive device: Secondary | ICD-10-CM | POA: Diagnosis not present

## 2021-06-25 ENCOUNTER — Other Ambulatory Visit (HOSPITAL_COMMUNITY): Payer: Self-pay

## 2021-06-26 ENCOUNTER — Other Ambulatory Visit (HOSPITAL_COMMUNITY): Payer: Self-pay

## 2021-06-28 DIAGNOSIS — H5203 Hypermetropia, bilateral: Secondary | ICD-10-CM | POA: Diagnosis not present

## 2021-07-04 ENCOUNTER — Other Ambulatory Visit: Payer: Self-pay

## 2021-07-17 ENCOUNTER — Other Ambulatory Visit: Payer: Self-pay

## 2021-07-17 DIAGNOSIS — R41840 Attention and concentration deficit: Secondary | ICD-10-CM | POA: Diagnosis not present

## 2021-07-17 DIAGNOSIS — G43109 Migraine with aura, not intractable, without status migrainosus: Secondary | ICD-10-CM | POA: Diagnosis not present

## 2021-07-17 MED ORDER — AMPHETAMINE-DEXTROAMPHETAMINE 15 MG PO TABS
ORAL_TABLET | ORAL | 0 refills | Status: DC
  Filled 2021-07-17: qty 60, 30d supply, fill #0

## 2021-07-20 ENCOUNTER — Other Ambulatory Visit (HOSPITAL_COMMUNITY): Payer: Self-pay

## 2021-07-23 ENCOUNTER — Other Ambulatory Visit (HOSPITAL_COMMUNITY): Payer: Self-pay

## 2021-07-24 ENCOUNTER — Other Ambulatory Visit (HOSPITAL_COMMUNITY): Payer: Self-pay

## 2021-08-02 ENCOUNTER — Other Ambulatory Visit: Payer: Self-pay

## 2021-08-15 ENCOUNTER — Other Ambulatory Visit (HOSPITAL_COMMUNITY): Payer: Self-pay

## 2021-08-15 ENCOUNTER — Other Ambulatory Visit: Payer: Self-pay | Admitting: Pharmacist

## 2021-08-15 MED ORDER — DUPIXENT 300 MG/2ML ~~LOC~~ SOSY
PREFILLED_SYRINGE | SUBCUTANEOUS | 6 refills | Status: DC
  Filled 2021-08-15: qty 2, 28d supply, fill #0
  Filled 2021-09-25 – 2021-09-26 (×2): qty 2, 28d supply, fill #1
  Filled 2021-10-22: qty 4, 28d supply, fill #2

## 2021-08-16 ENCOUNTER — Other Ambulatory Visit (HOSPITAL_COMMUNITY): Payer: Self-pay

## 2021-08-21 DIAGNOSIS — Z30431 Encounter for routine checking of intrauterine contraceptive device: Secondary | ICD-10-CM | POA: Diagnosis not present

## 2021-08-22 ENCOUNTER — Other Ambulatory Visit: Payer: Self-pay

## 2021-08-22 MED ORDER — AMPHETAMINE-DEXTROAMPHETAMINE 15 MG PO TABS
ORAL_TABLET | ORAL | 0 refills | Status: DC
  Filled 2021-08-22: qty 60, 30d supply, fill #0

## 2021-08-23 ENCOUNTER — Other Ambulatory Visit: Payer: Self-pay

## 2021-08-25 DIAGNOSIS — R41840 Attention and concentration deficit: Secondary | ICD-10-CM | POA: Diagnosis not present

## 2021-08-29 ENCOUNTER — Other Ambulatory Visit: Payer: Self-pay

## 2021-08-29 MED ORDER — NURTEC 75 MG PO TBDP
ORAL_TABLET | ORAL | 6 refills | Status: DC
  Filled 2021-08-29: qty 16, 30d supply, fill #0
  Filled 2021-10-23: qty 16, 30d supply, fill #1
  Filled 2021-12-14: qty 16, 30d supply, fill #2
  Filled 2022-01-28: qty 16, 30d supply, fill #3
  Filled 2022-02-23: qty 16, 30d supply, fill #4
  Filled 2022-03-21: qty 16, 30d supply, fill #5
  Filled 2022-04-30: qty 16, 30d supply, fill #6

## 2021-08-29 MED ORDER — QULIPTA 60 MG PO TABS
ORAL_TABLET | ORAL | 3 refills | Status: DC
  Filled 2021-08-29 (×2): qty 30, 30d supply, fill #0
  Filled 2022-01-28: qty 30, 30d supply, fill #1

## 2021-08-30 ENCOUNTER — Other Ambulatory Visit: Payer: Self-pay

## 2021-09-01 DIAGNOSIS — G43109 Migraine with aura, not intractable, without status migrainosus: Secondary | ICD-10-CM | POA: Diagnosis not present

## 2021-09-06 ENCOUNTER — Telehealth: Payer: 59 | Admitting: Family Medicine

## 2021-09-06 DIAGNOSIS — J029 Acute pharyngitis, unspecified: Secondary | ICD-10-CM

## 2021-09-06 NOTE — Progress Notes (Signed)
[place]  ? ?This was for daughter ?Advised of steps to get daughter care she needs. ? ?

## 2021-09-10 ENCOUNTER — Other Ambulatory Visit (HOSPITAL_COMMUNITY): Payer: Self-pay

## 2021-09-12 ENCOUNTER — Other Ambulatory Visit (HOSPITAL_COMMUNITY): Payer: Self-pay

## 2021-09-25 ENCOUNTER — Other Ambulatory Visit: Payer: Self-pay

## 2021-09-25 MED ORDER — AMPHETAMINE-DEXTROAMPHETAMINE 15 MG PO TABS
ORAL_TABLET | ORAL | 0 refills | Status: DC
  Filled 2021-09-25 – 2021-09-26 (×2): qty 60, 30d supply, fill #0

## 2021-09-26 ENCOUNTER — Other Ambulatory Visit: Payer: Self-pay

## 2021-09-26 ENCOUNTER — Other Ambulatory Visit (HOSPITAL_COMMUNITY): Payer: Self-pay

## 2021-10-16 ENCOUNTER — Other Ambulatory Visit (HOSPITAL_COMMUNITY): Payer: Self-pay

## 2021-10-22 ENCOUNTER — Other Ambulatory Visit (HOSPITAL_COMMUNITY): Payer: Self-pay

## 2021-10-23 ENCOUNTER — Other Ambulatory Visit: Payer: Self-pay

## 2021-10-25 ENCOUNTER — Other Ambulatory Visit (HOSPITAL_COMMUNITY): Payer: Self-pay

## 2021-10-25 DIAGNOSIS — Z131 Encounter for screening for diabetes mellitus: Secondary | ICD-10-CM | POA: Diagnosis not present

## 2021-10-25 DIAGNOSIS — Z130 Encounter for screening for diseases of the blood and blood-forming organs and certain disorders involving the immune mechanism: Secondary | ICD-10-CM | POA: Diagnosis not present

## 2021-10-25 DIAGNOSIS — Z1329 Encounter for screening for other suspected endocrine disorder: Secondary | ICD-10-CM | POA: Diagnosis not present

## 2021-10-25 DIAGNOSIS — E559 Vitamin D deficiency, unspecified: Secondary | ICD-10-CM | POA: Diagnosis not present

## 2021-10-25 DIAGNOSIS — Z13220 Encounter for screening for lipoid disorders: Secondary | ICD-10-CM | POA: Diagnosis not present

## 2021-10-25 DIAGNOSIS — F419 Anxiety disorder, unspecified: Secondary | ICD-10-CM | POA: Diagnosis not present

## 2021-10-25 DIAGNOSIS — G43109 Migraine with aura, not intractable, without status migrainosus: Secondary | ICD-10-CM | POA: Diagnosis not present

## 2021-10-25 DIAGNOSIS — Z0000 Encounter for general adult medical examination without abnormal findings: Secondary | ICD-10-CM | POA: Diagnosis not present

## 2021-10-25 DIAGNOSIS — R41840 Attention and concentration deficit: Secondary | ICD-10-CM | POA: Diagnosis not present

## 2021-10-26 ENCOUNTER — Other Ambulatory Visit (HOSPITAL_COMMUNITY): Payer: Self-pay

## 2021-10-26 ENCOUNTER — Other Ambulatory Visit: Payer: Self-pay | Admitting: Pharmacist

## 2021-10-26 MED ORDER — DUPIXENT 300 MG/2ML ~~LOC~~ SOSY
PREFILLED_SYRINGE | SUBCUTANEOUS | 5 refills | Status: DC
Start: 2021-10-26 — End: 2022-05-01
  Filled 2021-10-26: qty 4, fill #0
  Filled 2021-11-19: qty 4, 28d supply, fill #0
  Filled 2021-12-17: qty 4, 28d supply, fill #1
  Filled 2022-01-10: qty 4, 28d supply, fill #2
  Filled 2022-02-06: qty 4, 28d supply, fill #3
  Filled 2022-03-07: qty 4, 28d supply, fill #4
  Filled 2022-04-03: qty 4, 28d supply, fill #5

## 2021-10-26 MED ORDER — DUPIXENT 300 MG/2ML ~~LOC~~ SOSY
PREFILLED_SYRINGE | SUBCUTANEOUS | 5 refills | Status: DC
  Filled 2021-10-26: qty 4, 28d supply, fill #0

## 2021-11-19 ENCOUNTER — Other Ambulatory Visit (HOSPITAL_COMMUNITY): Payer: Self-pay

## 2021-11-27 ENCOUNTER — Other Ambulatory Visit: Payer: Self-pay

## 2021-11-27 MED ORDER — AMPHETAMINE-DEXTROAMPHETAMINE 15 MG PO TABS
ORAL_TABLET | ORAL | 0 refills | Status: DC
  Filled 2021-11-27: qty 60, 30d supply, fill #0

## 2021-12-13 ENCOUNTER — Other Ambulatory Visit (HOSPITAL_COMMUNITY): Payer: Self-pay

## 2021-12-14 ENCOUNTER — Other Ambulatory Visit: Payer: Self-pay

## 2021-12-17 ENCOUNTER — Other Ambulatory Visit (HOSPITAL_COMMUNITY): Payer: Self-pay

## 2021-12-19 ENCOUNTER — Ambulatory Visit: Payer: 59 | Attending: Internal Medicine | Admitting: Pharmacist

## 2021-12-19 DIAGNOSIS — Z79899 Other long term (current) drug therapy: Secondary | ICD-10-CM

## 2021-12-19 NOTE — Progress Notes (Signed)
   S: Patient presents for review of their specialty medication therapy.  Patient is currently taking Dupixent for atopic dermatitis. Patient is managed by Dr. Fontaine No for this.   Adherence: denies any missed doses  Efficacy: reports that it is working well and denies any adverse effects  Dosing: 300 mg every 2 weeks  Drug-drug interactions: none  Monitoring: Local injection site reactions: denies Hypersensitivity: denies Eosinophilia: denies Ocular effects: denies S/sx of parasitic infections: denies  O:     Lab Results  Component Value Date   WBC 7.3 03/28/2019   HGB 14.4 03/28/2019   HCT 41.9 03/28/2019   MCV 94.8 03/28/2019   PLT 292 03/28/2019      Chemistry      Component Value Date/Time   NA 133 (L) 03/28/2019 2250   NA 139 03/01/2014 1131   K 3.4 (L) 03/28/2019 2250   K 4.0 03/01/2014 1131   CL 103 03/28/2019 2250   CL 108 (H) 03/01/2014 1131   CO2 21 (L) 03/28/2019 2250   CO2 23 03/01/2014 1131   BUN 9 03/28/2019 2250   BUN 5 (L) 03/01/2014 1131   CREATININE 0.62 03/28/2019 2250   CREATININE 0.60 03/01/2014 1131      Component Value Date/Time   CALCIUM 9.0 03/28/2019 2250   CALCIUM 9.0 03/01/2014 1131   ALKPHOS 39 03/28/2019 2250   ALKPHOS 81 05/22/2013 2040   AST 18 03/28/2019 2250   AST 40 (H) 05/22/2013 2040   ALT 15 03/28/2019 2250   ALT 84 (H) 05/22/2013 2040   BILITOT 0.4 03/28/2019 2250   BILITOT 0.3 05/22/2013 2040       A/P: 1. Medication review: Patient currently on [employer] for atopic dermatitis and is tolerating it well with no adverse effects. Reviewed the medication, including the following: Dupixent is a monoclonal antibody used to decrease inflammation. Possible adverse effects include injection site reactions, ocular adverse effects, eosinophilia, vasculitis, and increased risk of parasitic infections. No recommendations for any changes.   Benard Halsted, PharmD, [facility], [place] [phone]

## 2021-12-20 ENCOUNTER — Other Ambulatory Visit (HOSPITAL_COMMUNITY): Payer: Self-pay

## 2022-01-02 ENCOUNTER — Other Ambulatory Visit: Payer: Self-pay

## 2022-01-02 MED ORDER — AMPHETAMINE-DEXTROAMPHETAMINE 15 MG PO TABS
ORAL_TABLET | ORAL | 0 refills | Status: DC
  Filled 2022-01-02: qty 60, 30d supply, fill #0

## 2022-01-10 ENCOUNTER — Other Ambulatory Visit (HOSPITAL_COMMUNITY): Payer: Self-pay

## 2022-01-14 ENCOUNTER — Other Ambulatory Visit (HOSPITAL_COMMUNITY): Payer: Self-pay

## 2022-01-28 ENCOUNTER — Other Ambulatory Visit: Payer: Self-pay

## 2022-01-31 ENCOUNTER — Other Ambulatory Visit: Payer: Self-pay

## 2022-01-31 DIAGNOSIS — R41840 Attention and concentration deficit: Secondary | ICD-10-CM | POA: Diagnosis not present

## 2022-01-31 MED ORDER — AMPHETAMINE-DEXTROAMPHETAMINE 15 MG PO TABS
ORAL_TABLET | ORAL | 0 refills | Status: DC
  Filled 2022-01-31: qty 60, 30d supply, fill #0

## 2022-02-06 ENCOUNTER — Other Ambulatory Visit (HOSPITAL_COMMUNITY): Payer: Self-pay

## 2022-02-13 ENCOUNTER — Ambulatory Visit: Payer: 59 | Admitting: Neurosurgery

## 2022-02-13 DIAGNOSIS — M542 Cervicalgia: Secondary | ICD-10-CM

## 2022-02-13 DIAGNOSIS — R200 Anesthesia of skin: Secondary | ICD-10-CM

## 2022-02-13 DIAGNOSIS — M79601 Pain in right arm: Secondary | ICD-10-CM

## 2022-02-13 NOTE — Progress Notes (Signed)
Referring Physician:  Crist Infante, MD [address] [place],  [place] [address]  Primary Physician:  Ashley Infante, MD  History of Present Illness: 02/13/2022 [name] is here today with a chief complaint of neck, shoulder blade, and arm discomfort.  She had a previous bout of similar pain in her neck and R shoulder blade several years ago.  She had trigger point injections at the time, which helped immensely.  She has been doing well until recently when she developed pain in her neck (primarily R side) and into her R shoulder blade.  The pain is worse with activity.  Nothing has helped significantly.  She is also having numbness in both pinkies extending up her forearm on the ulnar side.  She expressed waking up in the middle of the night with numbness in her R hand when she sleeps on her stomach.   Conservative measures:  Physical therapy: none in past 12 months  Multimodal medical therapy including regular antiinflammatories: none  Injections: no epidural steroid injections  Past Surgery: none  Ashley Hancock has no symptoms of cervical myelopathy.  The symptoms are causing a significant impact on the patient's life.   Review of Systems:  A 10 point review of systems is negative, except for the pertinent positives and negatives detailed in the HPI.  Past Medical History: Past Medical History:  Diagnosis Date   Anxiety    Blood transfusion without reported diagnosis    1unit post laparotomy 2015   Dysrhythmia    irreg HR and PVCs   Heart murmur    History of gastric ulcer    IBS (irritable bowel syndrome)    Migraines    Motion sickness    PONV (postoperative nausea and vomiting)    nausea only    Past Surgical History: Past Surgical History:  Procedure Laterality Date   APPENDECTOMY     AUGMENTATION MAMMAPLASTY Bilateral 2018   BIOPSY N/A 04/05/2019   Procedure: BIOPSY;  Surgeon: Ashley Lame, MD;  Location: [facility];  Service: Endoscopy;   Laterality: N/A;   CHOLECYSTECTOMY  2011   COLONOSCOPY WITH PROPOFOL N/A 04/05/2019   Procedure: COLONOSCOPY WITH PROPOFOL;  Surgeon: Ashley Lame, MD;  Location: [facility];  Service: Endoscopy;  Laterality: N/A;   ESOPHAGOGASTRODUODENOSCOPY (EGD) WITH PROPOFOL N/A 04/05/2019   Procedure: ESOPHAGOGASTRODUODENOSCOPY (EGD) WITH PROPOFOL;  Surgeon: Ashley Lame, MD;  Location: [facility];  Service: Endoscopy;  Laterality: N/A;   HEMORRHOID SURGERY N/A 12/18/2015   Procedure:  I & D THROMBUS HEMORRHOID;  Surgeon: Jules Husbands, MD;  Location: [facility];  Service: General;  Laterality: N/A;   LAPAROSCOPY     LAPAROTOMY     x2   RHINOPLASTY  2012   WISDOM TOOTH EXTRACTION      Allergies: Allergies as of 02/13/2022 - Review Complete 09/06/2021  Allergen Reaction Noted   Bacitracin Rash 12/18/2015    Medications: No outpatient medications have been marked as taking for the 02/13/22 encounter (Appointment) with Ashley Maw, MD.    Social History: Social History   Tobacco Use   Smoking status: Every Day    Packs/day: 0.50    Years: 20.00    Total pack years: 10.00    Types: Cigarettes   Smokeless tobacco: Never   Tobacco comments:    started age 73  Vaping Use   Vaping Use: Some days   Substances: Nicotine   Devices: Juul  Substance Use Topics   Alcohol use: Yes  Alcohol/week: 8.0 standard drinks of alcohol    Types: 8 Glasses of wine per week   Drug use: No    Family Medical History: Family History  Problem Relation Age of Onset   Hypertension Mother    Thyroid disease Mother    Bipolar disorder Mother    Heart disease Father        CHF;MI   Diabetes Father    Breast cancer Neg Hx     Physical Examination: Telephone visit - no exam performed.  Medical Decision Making  Imaging: No new imaging to review  I have personally reviewed the images and agree with the above interpretation.  Assessment and Plan: [name] is a pleasant 41  y.o. female with cervicalgia, shoulder blade pain (possibly radicular), R arm pain, or possible ulnar neuropathy.  She previously had excellent results with trigger point injections.  I have referred her for consideration of these again, and have referred her for physical therapy to help with her neck pain and arm discomfort.  She can also consider NSAIDs if she is able to tolerate that.  I will touch base with her in ~6 weeks.  If she is not doing better at that point, we will have to consider MRI cervical spine and EMG BUE.   I spent a total of 15 minutes in face-to-face and non-face-to-face activities related to this patient's care today.  Thank you for involving me in the care of this patient.      Ashley Harcum K. Izora Ribas MD, [facility] Neurosurgery

## 2022-02-18 DIAGNOSIS — H65111 Acute and subacute allergic otitis media (mucoid) (sanguinous) (serous), right ear: Secondary | ICD-10-CM | POA: Diagnosis not present

## 2022-02-24 ENCOUNTER — Other Ambulatory Visit: Payer: Self-pay

## 2022-02-25 ENCOUNTER — Other Ambulatory Visit: Payer: Self-pay

## 2022-03-06 ENCOUNTER — Other Ambulatory Visit (HOSPITAL_COMMUNITY): Payer: Self-pay

## 2022-03-07 ENCOUNTER — Other Ambulatory Visit (HOSPITAL_COMMUNITY): Payer: Self-pay

## 2022-03-13 ENCOUNTER — Other Ambulatory Visit: Payer: Self-pay

## 2022-03-13 MED ORDER — AMPHETAMINE-DEXTROAMPHETAMINE 15 MG PO TABS
ORAL_TABLET | ORAL | 0 refills | Status: DC
  Filled 2022-03-13: qty 60, 30d supply, fill #0

## 2022-03-21 ENCOUNTER — Other Ambulatory Visit: Payer: Self-pay

## 2022-03-22 ENCOUNTER — Other Ambulatory Visit: Payer: Self-pay

## 2022-03-26 ENCOUNTER — Other Ambulatory Visit: Payer: Self-pay

## 2022-03-26 DIAGNOSIS — M5412 Radiculopathy, cervical region: Secondary | ICD-10-CM | POA: Diagnosis not present

## 2022-03-26 DIAGNOSIS — Z8739 Personal history of other diseases of the musculoskeletal system and connective tissue: Secondary | ICD-10-CM | POA: Diagnosis not present

## 2022-03-26 DIAGNOSIS — M7918 Myalgia, other site: Secondary | ICD-10-CM | POA: Diagnosis not present

## 2022-03-26 DIAGNOSIS — M5030 Other cervical disc degeneration, unspecified cervical region: Secondary | ICD-10-CM | POA: Diagnosis not present

## 2022-03-26 DIAGNOSIS — M5010 Cervical disc disorder with radiculopathy, unspecified cervical region: Secondary | ICD-10-CM | POA: Diagnosis not present

## 2022-03-26 MED ORDER — GABAPENTIN 300 MG PO CAPS
ORAL_CAPSULE | ORAL | 5 refills | Status: DC
  Filled 2022-03-26: qty 30, 30d supply, fill #0

## 2022-04-03 ENCOUNTER — Other Ambulatory Visit (HOSPITAL_COMMUNITY): Payer: Self-pay

## 2022-04-05 DIAGNOSIS — M542 Cervicalgia: Secondary | ICD-10-CM | POA: Diagnosis not present

## 2022-04-09 ENCOUNTER — Other Ambulatory Visit (HOSPITAL_COMMUNITY): Payer: Self-pay

## 2022-04-11 DIAGNOSIS — M542 Cervicalgia: Secondary | ICD-10-CM | POA: Diagnosis not present

## 2022-04-12 ENCOUNTER — Other Ambulatory Visit: Payer: Self-pay

## 2022-04-12 MED ORDER — AMPHETAMINE-DEXTROAMPHETAMINE 15 MG PO TABS
15.0000 mg | ORAL_TABLET | Freq: Two times a day (BID) | ORAL | 0 refills | Status: DC
  Filled 2022-04-12: qty 60, 30d supply, fill #0

## 2022-04-22 ENCOUNTER — Other Ambulatory Visit: Payer: Self-pay

## 2022-04-22 DIAGNOSIS — M542 Cervicalgia: Secondary | ICD-10-CM | POA: Diagnosis not present

## 2022-04-22 DIAGNOSIS — R590 Localized enlarged lymph nodes: Secondary | ICD-10-CM | POA: Diagnosis not present

## 2022-04-22 MED ORDER — BACLOFEN 5 MG PO TABS
ORAL_TABLET | ORAL | 0 refills | Status: DC
  Filled 2022-04-22: qty 30, 5d supply, fill #0

## 2022-04-22 MED ORDER — HYDROCODONE-ACETAMINOPHEN 5-325 MG PO TABS
ORAL_TABLET | ORAL | 0 refills | Status: DC
  Filled 2022-04-22: qty 15, 5d supply, fill #0

## 2022-04-22 MED ORDER — PREDNISONE 20 MG PO TABS
ORAL_TABLET | ORAL | 0 refills | Status: DC
  Filled 2022-04-22: qty 5, 5d supply, fill #0

## 2022-04-25 DIAGNOSIS — M542 Cervicalgia: Secondary | ICD-10-CM | POA: Diagnosis not present

## 2022-04-29 ENCOUNTER — Other Ambulatory Visit (HOSPITAL_COMMUNITY): Payer: Self-pay

## 2022-04-30 ENCOUNTER — Other Ambulatory Visit: Payer: Self-pay

## 2022-04-30 DIAGNOSIS — R41840 Attention and concentration deficit: Secondary | ICD-10-CM | POA: Diagnosis not present

## 2022-04-30 MED ORDER — TRI-LUMA 0.01-4-0.05 % EX CREA
TOPICAL_CREAM | CUTANEOUS | 2 refills | Status: DC
  Filled 2022-04-30: qty 30, 30d supply, fill #0
  Filled 2022-09-13: qty 30, 30d supply, fill #1

## 2022-04-30 MED ORDER — AMPHETAMINE-DEXTROAMPHETAMINE 20 MG PO TABS
20.0000 mg | ORAL_TABLET | Freq: Every day | ORAL | 0 refills | Status: DC
  Filled 2022-04-30: qty 30, 30d supply, fill #0

## 2022-04-30 MED ORDER — AMPHETAMINE-DEXTROAMPHETAMINE 15 MG PO TABS
15.0000 mg | ORAL_TABLET | Freq: Every day | ORAL | 0 refills | Status: DC
  Filled 2022-05-23: qty 30, 30d supply, fill #0

## 2022-05-01 ENCOUNTER — Other Ambulatory Visit: Payer: Self-pay

## 2022-05-01 ENCOUNTER — Other Ambulatory Visit (HOSPITAL_COMMUNITY): Payer: Self-pay

## 2022-05-01 ENCOUNTER — Other Ambulatory Visit: Payer: Self-pay | Admitting: Pharmacist

## 2022-05-01 MED ORDER — DUPIXENT 300 MG/2ML ~~LOC~~ SOSY
PREFILLED_SYRINGE | SUBCUTANEOUS | 2 refills | Status: DC
  Filled 2022-05-01: qty 4, 28d supply, fill #0
  Filled 2022-05-22: qty 4, 28d supply, fill #1
  Filled 2022-06-24: qty 4, 28d supply, fill #2

## 2022-05-01 MED ORDER — DUPIXENT 300 MG/2ML ~~LOC~~ SOSY: PREFILLED_SYRINGE | SUBCUTANEOUS | 2 refills | Status: DC

## 2022-05-02 ENCOUNTER — Other Ambulatory Visit (HOSPITAL_COMMUNITY): Payer: Self-pay

## 2022-05-02 DIAGNOSIS — M542 Cervicalgia: Secondary | ICD-10-CM | POA: Diagnosis not present

## 2022-05-07 DIAGNOSIS — M542 Cervicalgia: Secondary | ICD-10-CM | POA: Diagnosis not present

## 2022-05-14 ENCOUNTER — Other Ambulatory Visit: Payer: Self-pay | Admitting: Family Medicine

## 2022-05-14 DIAGNOSIS — M5136 Other intervertebral disc degeneration, lumbar region: Secondary | ICD-10-CM | POA: Diagnosis not present

## 2022-05-14 DIAGNOSIS — M5412 Radiculopathy, cervical region: Secondary | ICD-10-CM | POA: Diagnosis not present

## 2022-05-14 DIAGNOSIS — M545 Low back pain, unspecified: Secondary | ICD-10-CM | POA: Diagnosis not present

## 2022-05-14 DIAGNOSIS — M5030 Other cervical disc degeneration, unspecified cervical region: Secondary | ICD-10-CM | POA: Diagnosis not present

## 2022-05-14 DIAGNOSIS — M5416 Radiculopathy, lumbar region: Secondary | ICD-10-CM | POA: Diagnosis not present

## 2022-05-20 ENCOUNTER — Other Ambulatory Visit (HOSPITAL_COMMUNITY): Payer: Self-pay

## 2022-05-22 ENCOUNTER — Other Ambulatory Visit (HOSPITAL_COMMUNITY): Payer: Self-pay

## 2022-05-23 ENCOUNTER — Ambulatory Visit
Admission: RE | Admit: 2022-05-23 | Discharge: 2022-05-23 | Disposition: A | Discharge status: Home / Self Care | Payer: 59 | Source: Ambulatory Visit | Attending: Family Medicine | Admitting: Family Medicine

## 2022-05-23 ENCOUNTER — Other Ambulatory Visit: Payer: Self-pay

## 2022-05-23 DIAGNOSIS — M5412 Radiculopathy, cervical region: Secondary | ICD-10-CM | POA: Insufficient documentation

## 2022-05-23 DIAGNOSIS — M50122 Cervical disc disorder at C5-C6 level with radiculopathy: Secondary | ICD-10-CM | POA: Diagnosis not present

## 2022-05-23 DIAGNOSIS — M50121 Cervical disc disorder at C4-C5 level with radiculopathy: Secondary | ICD-10-CM | POA: Diagnosis not present

## 2022-05-23 DIAGNOSIS — M4802 Spinal stenosis, cervical region: Secondary | ICD-10-CM | POA: Diagnosis not present

## 2022-05-24 ENCOUNTER — Other Ambulatory Visit: Payer: Self-pay

## 2022-05-24 MED ORDER — BUTALBITAL-APAP-CAFFEINE 50-325-40 MG PO TABS
1.0000 | ORAL_TABLET | ORAL | 1 refills | Status: DC | PRN
  Filled 2022-05-24: qty 90, 15d supply, fill #0
  Filled 2022-10-14: qty 90, 15d supply, fill #1

## 2022-05-24 MED ORDER — NURTEC 75 MG PO TBDP
ORAL_TABLET | ORAL | 5 refills | Status: DC
  Filled 2022-05-24: qty 16, 30d supply, fill #0
  Filled 2022-06-26: qty 16, 30d supply, fill #1
  Filled 2022-07-30: qty 16, 30d supply, fill #2
  Filled 2022-08-22: qty 16, 30d supply, fill #3
  Filled 2022-09-16: qty 16, 30d supply, fill #4
  Filled 2022-10-14: qty 16, 30d supply, fill #5

## 2022-05-27 ENCOUNTER — Other Ambulatory Visit (HOSPITAL_COMMUNITY): Payer: Self-pay

## 2022-05-28 ENCOUNTER — Other Ambulatory Visit (HOSPITAL_COMMUNITY): Payer: Self-pay

## 2022-05-29 ENCOUNTER — Other Ambulatory Visit (HOSPITAL_COMMUNITY): Payer: Self-pay

## 2022-05-31 ENCOUNTER — Other Ambulatory Visit (HOSPITAL_COMMUNITY): Payer: Self-pay

## 2022-06-03 ENCOUNTER — Other Ambulatory Visit: Payer: Self-pay

## 2022-06-03 ENCOUNTER — Other Ambulatory Visit (HOSPITAL_COMMUNITY): Payer: Self-pay

## 2022-06-03 MED ORDER — AMPHETAMINE-DEXTROAMPHETAMINE 20 MG PO TABS
20.0000 mg | ORAL_TABLET | Freq: Every day | ORAL | 0 refills | Status: DC
  Filled 2022-06-03: qty 30, 30d supply, fill #0

## 2022-06-20 ENCOUNTER — Other Ambulatory Visit (HOSPITAL_COMMUNITY): Payer: Self-pay

## 2022-06-24 ENCOUNTER — Other Ambulatory Visit (HOSPITAL_COMMUNITY): Payer: Self-pay

## 2022-06-25 DIAGNOSIS — L308 Other specified dermatitis: Secondary | ICD-10-CM | POA: Diagnosis not present

## 2022-06-25 DIAGNOSIS — L2089 Other atopic dermatitis: Secondary | ICD-10-CM | POA: Diagnosis not present

## 2022-06-25 DIAGNOSIS — Z79899 Other long term (current) drug therapy: Secondary | ICD-10-CM | POA: Diagnosis not present

## 2022-06-26 ENCOUNTER — Other Ambulatory Visit: Payer: Self-pay

## 2022-06-26 IMAGING — DX DG CHEST 2V
2 series · 2 of 2 positions shown · non-contrast
Comparison: None.

CLINICAL DATA: Prolonged cough

EXAM:
CHEST - 2 VIEW

[chest pa]
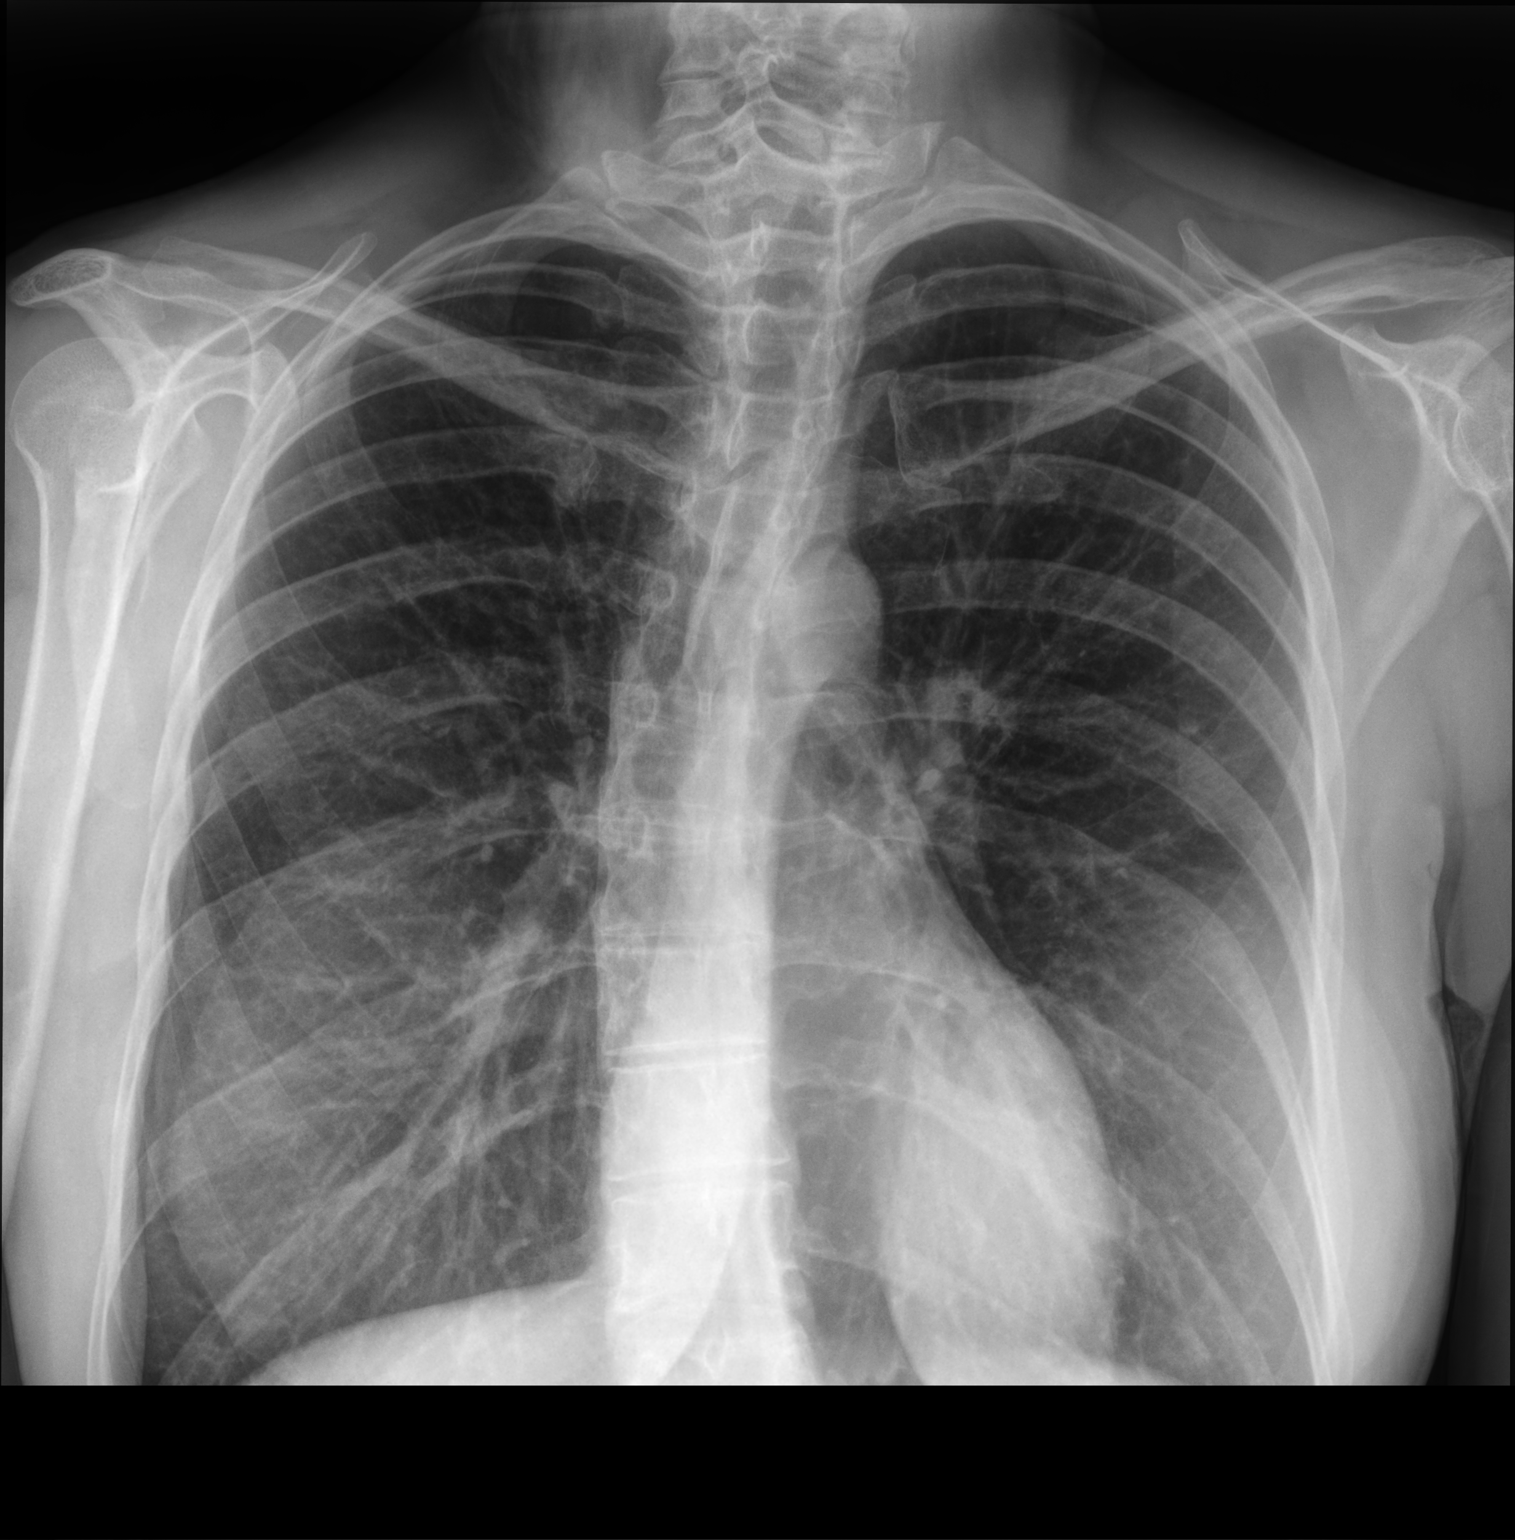

[chest lat]
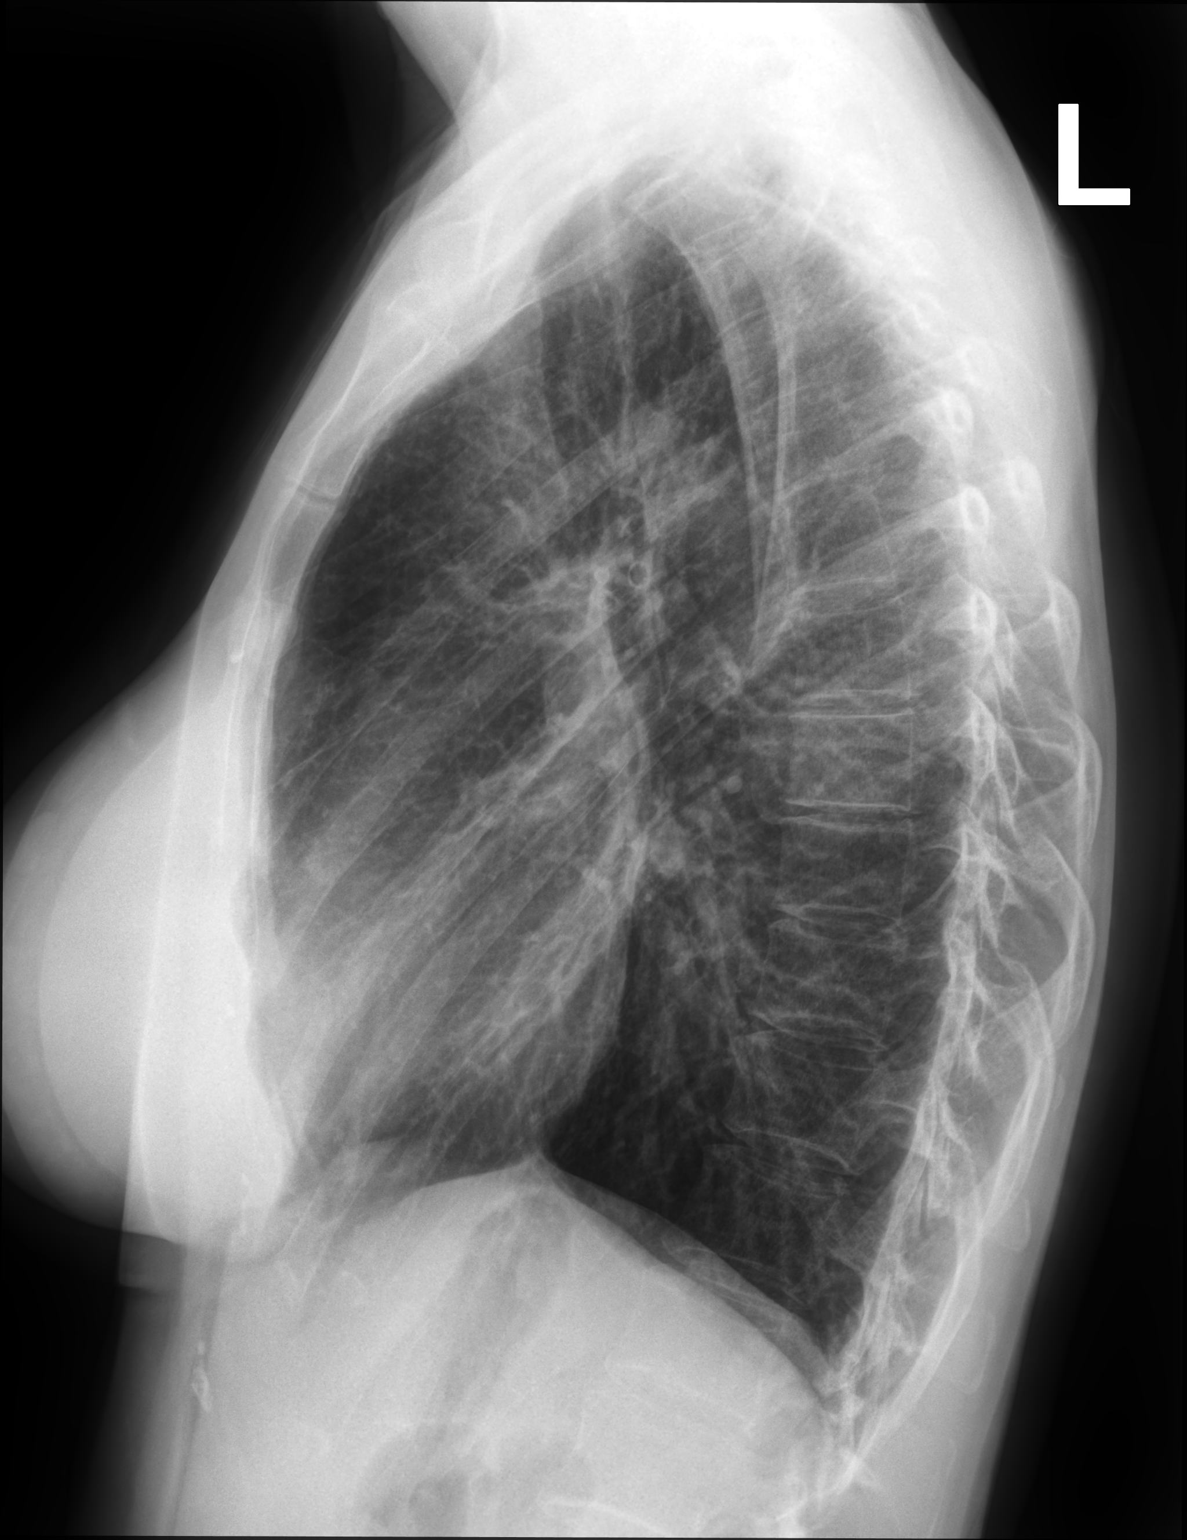

[2 of 2 positions shown; findings below may reference images not displayed]

FINDINGS: The heart size and mediastinal contours are within normal limits.
Both lungs are clear. Scoliosis of the spine.
IMPRESSION: No active cardiopulmonary disease.

## 2022-06-26 MED ORDER — AMPHETAMINE-DEXTROAMPHETAMINE 15 MG PO TABS
15.0000 mg | ORAL_TABLET | Freq: Every day | ORAL | 0 refills | Status: DC
  Filled 2022-06-26: qty 30, 30d supply, fill #0

## 2022-06-26 MED ORDER — AMPHETAMINE-DEXTROAMPHETAMINE 20 MG PO TABS
20.0000 mg | ORAL_TABLET | Freq: Every day | ORAL | 0 refills | Status: DC
  Filled 2022-06-26 – 2022-06-27 (×2): qty 30, 30d supply, fill #0

## 2022-06-27 ENCOUNTER — Other Ambulatory Visit: Payer: Self-pay

## 2022-07-02 ENCOUNTER — Other Ambulatory Visit (HOSPITAL_COMMUNITY): Payer: Self-pay

## 2022-07-22 ENCOUNTER — Other Ambulatory Visit (HOSPITAL_COMMUNITY): Payer: Self-pay

## 2022-07-23 ENCOUNTER — Other Ambulatory Visit (HOSPITAL_COMMUNITY): Payer: Self-pay

## 2022-07-23 DIAGNOSIS — Z6820 Body mass index (BMI) 20.0-20.9, adult: Secondary | ICD-10-CM | POA: Diagnosis not present

## 2022-07-23 DIAGNOSIS — Z01419 Encounter for gynecological examination (general) (routine) without abnormal findings: Secondary | ICD-10-CM | POA: Diagnosis not present

## 2022-07-23 DIAGNOSIS — Z3202 Encounter for pregnancy test, result negative: Secondary | ICD-10-CM | POA: Diagnosis not present

## 2022-07-24 ENCOUNTER — Other Ambulatory Visit (HOSPITAL_COMMUNITY): Payer: Self-pay

## 2022-07-24 MED ORDER — DUPIXENT 300 MG/2ML ~~LOC~~ SOSY: PREFILLED_SYRINGE | SUBCUTANEOUS | 11 refills | Status: DC

## 2022-07-25 ENCOUNTER — Other Ambulatory Visit: Payer: Self-pay

## 2022-07-25 ENCOUNTER — Other Ambulatory Visit: Payer: Self-pay | Admitting: Pharmacist

## 2022-07-25 ENCOUNTER — Other Ambulatory Visit (HOSPITAL_COMMUNITY): Payer: Self-pay

## 2022-07-25 MED ORDER — DUPIXENT 300 MG/2ML ~~LOC~~ SOSY
PREFILLED_SYRINGE | SUBCUTANEOUS | 11 refills | Status: DC
  Filled 2022-07-25: qty 2, 28d supply, fill #0
  Filled 2022-07-25 (×2): qty 4, 28d supply, fill #0
  Filled 2022-08-14: qty 4, 28d supply, fill #1
  Filled 2022-09-23: qty 4, 28d supply, fill #2
  Filled 2022-10-15: qty 4, 28d supply, fill #3
  Filled 2022-11-20: qty 4, 28d supply, fill #4
  Filled 2022-12-12: qty 4, 28d supply, fill #5
  Filled 2022-12-30: qty 4, 28d supply, fill #6
  Filled 2023-02-26: qty 4, 28d supply, fill #7
  Filled 2023-04-09: qty 4, 28d supply, fill #8
  Filled 2023-05-02: qty 4, 28d supply, fill #9
  Filled 2023-06-09: qty 4, 28d supply, fill #10
  Filled 2023-07-04: qty 4, 28d supply, fill #11

## 2022-07-30 ENCOUNTER — Other Ambulatory Visit: Payer: Self-pay

## 2022-07-30 DIAGNOSIS — R41840 Attention and concentration deficit: Secondary | ICD-10-CM | POA: Diagnosis not present

## 2022-07-30 DIAGNOSIS — E559 Vitamin D deficiency, unspecified: Secondary | ICD-10-CM | POA: Diagnosis not present

## 2022-07-30 DIAGNOSIS — G43109 Migraine with aura, not intractable, without status migrainosus: Secondary | ICD-10-CM | POA: Diagnosis not present

## 2022-07-30 MED ORDER — AMPHETAMINE-DEXTROAMPHETAMINE 15 MG PO TABS
15.0000 mg | ORAL_TABLET | Freq: Every day | ORAL | 0 refills | Status: DC
  Filled 2022-07-30: qty 30, 30d supply, fill #0

## 2022-07-30 MED ORDER — AMPHETAMINE-DEXTROAMPHETAMINE 20 MG PO TABS
20.0000 mg | ORAL_TABLET | Freq: Every morning | ORAL | 0 refills | Status: DC
  Filled 2022-07-30: qty 30, 30d supply, fill #0

## 2022-07-31 ENCOUNTER — Other Ambulatory Visit: Payer: Self-pay

## 2022-08-01 ENCOUNTER — Other Ambulatory Visit (HOSPITAL_COMMUNITY): Payer: Self-pay

## 2022-08-02 ENCOUNTER — Other Ambulatory Visit: Payer: Self-pay

## 2022-08-02 ENCOUNTER — Other Ambulatory Visit (HOSPITAL_COMMUNITY): Payer: Self-pay

## 2022-08-07 ENCOUNTER — Other Ambulatory Visit: Payer: Self-pay

## 2022-08-08 ENCOUNTER — Other Ambulatory Visit: Payer: Self-pay

## 2022-08-08 DIAGNOSIS — D1801 Hemangioma of skin and subcutaneous tissue: Secondary | ICD-10-CM | POA: Diagnosis not present

## 2022-08-08 DIAGNOSIS — D2262 Melanocytic nevi of left upper limb, including shoulder: Secondary | ICD-10-CM | POA: Diagnosis not present

## 2022-08-08 DIAGNOSIS — D2272 Melanocytic nevi of left lower limb, including hip: Secondary | ICD-10-CM | POA: Diagnosis not present

## 2022-08-08 DIAGNOSIS — D2261 Melanocytic nevi of right upper limb, including shoulder: Secondary | ICD-10-CM | POA: Diagnosis not present

## 2022-08-08 DIAGNOSIS — L720 Epidermal cyst: Secondary | ICD-10-CM | POA: Diagnosis not present

## 2022-08-08 DIAGNOSIS — D224 Melanocytic nevi of scalp and neck: Secondary | ICD-10-CM | POA: Diagnosis not present

## 2022-08-08 DIAGNOSIS — D2271 Melanocytic nevi of right lower limb, including hip: Secondary | ICD-10-CM | POA: Diagnosis not present

## 2022-08-08 DIAGNOSIS — L2089 Other atopic dermatitis: Secondary | ICD-10-CM | POA: Diagnosis not present

## 2022-08-08 DIAGNOSIS — L853 Xerosis cutis: Secondary | ICD-10-CM | POA: Diagnosis not present

## 2022-08-08 DIAGNOSIS — D225 Melanocytic nevi of trunk: Secondary | ICD-10-CM | POA: Diagnosis not present

## 2022-08-08 MED ORDER — FLUOCINOLONE ACETONIDE BODY 0.01 % EX OIL
1.0000 | TOPICAL_OIL | Freq: Every evening | CUTANEOUS | 2 refills | Status: DC
  Filled 2022-08-08: qty 118.28, 30d supply, fill #0

## 2022-08-09 ENCOUNTER — Other Ambulatory Visit: Payer: Self-pay

## 2022-08-09 MED ORDER — COLESTIPOL HCL 1 G PO TABS
2.0000 g | ORAL_TABLET | Freq: Three times a day (TID) | ORAL | 3 refills | Status: DC
  Filled 2022-08-09: qty 180, 30d supply, fill #0

## 2022-08-14 ENCOUNTER — Other Ambulatory Visit (HOSPITAL_COMMUNITY): Payer: Self-pay

## 2022-08-19 ENCOUNTER — Other Ambulatory Visit: Payer: Self-pay

## 2022-08-22 ENCOUNTER — Other Ambulatory Visit: Payer: Self-pay

## 2022-08-22 ENCOUNTER — Other Ambulatory Visit (HOSPITAL_COMMUNITY): Payer: Self-pay

## 2022-08-26 ENCOUNTER — Other Ambulatory Visit: Payer: Self-pay

## 2022-08-29 ENCOUNTER — Other Ambulatory Visit (HOSPITAL_COMMUNITY): Payer: Self-pay

## 2022-09-03 ENCOUNTER — Other Ambulatory Visit: Payer: Self-pay

## 2022-09-03 MED ORDER — AMPHETAMINE-DEXTROAMPHETAMINE 15 MG PO TABS
15.0000 mg | ORAL_TABLET | Freq: Every day | ORAL | 0 refills | Status: DC
  Filled 2022-09-03: qty 30, 30d supply, fill #0

## 2022-09-03 MED ORDER — AMPHETAMINE-DEXTROAMPHETAMINE 20 MG PO TABS
20.0000 mg | ORAL_TABLET | Freq: Every morning | ORAL | 0 refills | Status: DC
  Filled 2022-09-03: qty 30, 30d supply, fill #0

## 2022-09-13 ENCOUNTER — Other Ambulatory Visit: Payer: Self-pay

## 2022-09-16 ENCOUNTER — Other Ambulatory Visit: Payer: Self-pay

## 2022-09-16 ENCOUNTER — Other Ambulatory Visit (HOSPITAL_COMMUNITY): Payer: Self-pay

## 2022-09-17 ENCOUNTER — Other Ambulatory Visit: Payer: Self-pay

## 2022-09-17 ENCOUNTER — Other Ambulatory Visit (HOSPITAL_COMMUNITY): Payer: Self-pay

## 2022-09-23 ENCOUNTER — Other Ambulatory Visit (HOSPITAL_COMMUNITY): Payer: Self-pay

## 2022-09-23 ENCOUNTER — Other Ambulatory Visit: Payer: Self-pay

## 2022-10-07 ENCOUNTER — Other Ambulatory Visit: Payer: Self-pay

## 2022-10-07 MED ORDER — AMPHETAMINE-DEXTROAMPHETAMINE 15 MG PO TABS
15.0000 mg | ORAL_TABLET | Freq: Every day | ORAL | 0 refills | Status: DC
  Filled 2022-10-07: qty 30, 30d supply, fill #0

## 2022-10-07 MED ORDER — AMPHETAMINE-DEXTROAMPHETAMINE 20 MG PO TABS
20.0000 mg | ORAL_TABLET | Freq: Every morning | ORAL | 0 refills | Status: DC
  Filled 2022-10-07: qty 30, 30d supply, fill #0

## 2022-10-14 ENCOUNTER — Other Ambulatory Visit: Payer: Self-pay

## 2022-10-15 ENCOUNTER — Other Ambulatory Visit (HOSPITAL_COMMUNITY): Payer: Self-pay

## 2022-10-15 ENCOUNTER — Other Ambulatory Visit: Payer: Self-pay

## 2022-10-29 ENCOUNTER — Other Ambulatory Visit: Payer: Self-pay

## 2022-10-29 MED ORDER — NURTEC 75 MG PO TBDP
75.0000 mg | ORAL_TABLET | Freq: Once | ORAL | 5 refills | Status: AC
  Filled 2022-10-29: qty 16, 16d supply, fill #0
  Filled 2023-06-03: qty 16, 30d supply, fill #0

## 2022-10-29 MED ORDER — AMPHETAMINE-DEXTROAMPHETAMINE 20 MG PO TABS
20.0000 mg | ORAL_TABLET | ORAL | 0 refills | Status: DC
  Filled 2022-11-05 – 2022-11-07 (×2): qty 30, 30d supply, fill #0

## 2022-10-29 MED ORDER — AIMOVIG 70 MG/ML ~~LOC~~ SOAJ
70.0000 | SUBCUTANEOUS | 0 refills | Status: DC
  Filled 2022-10-29: qty 1, 30d supply, fill #0
  Filled 2022-10-29: qty 3, 30d supply, fill #0
  Filled 2022-10-30: qty 1, 30d supply, fill #0
  Filled 2022-12-03: qty 1, 30d supply, fill #1
  Filled 2022-12-29: qty 1, 30d supply, fill #2

## 2022-10-29 MED ORDER — BUTALBITAL-APAP-CAFFEINE 50-325-40 MG PO TABS
1.0000 | ORAL_TABLET | ORAL | 1 refills | Status: DC
  Filled 2022-10-29: qty 90, 15d supply, fill #0
  Filled 2023-01-12: qty 90, 15d supply, fill #1

## 2022-10-29 MED ORDER — AMPHETAMINE-DEXTROAMPHETAMINE 15 MG PO TABS
15.0000 mg | ORAL_TABLET | Freq: Every day | ORAL | 0 refills | Status: DC
  Filled 2022-11-05: qty 30, 30d supply, fill #0

## 2022-10-30 ENCOUNTER — Other Ambulatory Visit: Payer: Self-pay

## 2022-10-30 ENCOUNTER — Encounter: Payer: Self-pay | Admitting: Pharmacist

## 2022-10-31 ENCOUNTER — Other Ambulatory Visit: Payer: Self-pay

## 2022-11-01 ENCOUNTER — Other Ambulatory Visit: Payer: Self-pay

## 2022-11-04 ENCOUNTER — Other Ambulatory Visit: Payer: Self-pay

## 2022-11-05 ENCOUNTER — Other Ambulatory Visit: Payer: Self-pay

## 2022-11-06 ENCOUNTER — Other Ambulatory Visit: Payer: Self-pay

## 2022-11-07 ENCOUNTER — Other Ambulatory Visit: Payer: Self-pay

## 2022-11-07 MED ORDER — NURTEC 75 MG PO TBDP
75.0000 mg | ORAL_TABLET | Freq: Every day | ORAL | 5 refills | Status: DC
  Filled 2022-11-07: qty 16, 30d supply, fill #0
  Filled 2022-12-03: qty 16, 30d supply, fill #1
  Filled 2023-01-12: qty 16, 30d supply, fill #2
  Filled 2023-02-10: qty 16, 30d supply, fill #3
  Filled 2023-03-17: qty 16, 30d supply, fill #4
  Filled 2023-04-26: qty 16, 30d supply, fill #5

## 2022-11-15 ENCOUNTER — Other Ambulatory Visit (HOSPITAL_COMMUNITY): Payer: Self-pay

## 2022-11-20 ENCOUNTER — Other Ambulatory Visit: Payer: Self-pay

## 2022-11-20 ENCOUNTER — Other Ambulatory Visit (HOSPITAL_COMMUNITY): Payer: Self-pay

## 2022-12-03 ENCOUNTER — Other Ambulatory Visit: Payer: Self-pay

## 2022-12-03 DIAGNOSIS — D485 Neoplasm of uncertain behavior of skin: Secondary | ICD-10-CM | POA: Diagnosis not present

## 2022-12-03 DIAGNOSIS — L811 Chloasma: Secondary | ICD-10-CM | POA: Diagnosis not present

## 2022-12-03 MED ORDER — TRETINOIN 0.05 % EX GEL
1.0000 | Freq: Every evening | CUTANEOUS | 5 refills | Status: AC
  Filled 2022-12-03: qty 45, 90d supply, fill #0

## 2022-12-03 MED ORDER — HYDROQUINONE 4 % EX CREA
1.0000 | TOPICAL_CREAM | Freq: Two times a day (BID) | CUTANEOUS | 5 refills | Status: AC
  Filled 2022-12-03: qty 28.35, 30d supply, fill #0
  Filled 2022-12-29: qty 28.35, 30d supply, fill #1
  Filled 2023-04-26: qty 28.35, 30d supply, fill #2
  Filled 2023-08-07: qty 28.35, 30d supply, fill #3
  Filled 2023-11-26: qty 28.35, 30d supply, fill #4

## 2022-12-04 ENCOUNTER — Other Ambulatory Visit: Payer: Self-pay

## 2022-12-05 ENCOUNTER — Other Ambulatory Visit: Payer: Self-pay

## 2022-12-05 MED ORDER — TRETINOIN 0.05 % EX CREA
TOPICAL_CREAM | CUTANEOUS | 5 refills | Status: DC
  Filled 2022-12-05: qty 45, 30d supply, fill #0
  Filled 2023-04-26: qty 45, 30d supply, fill #1
  Filled 2023-08-07: qty 45, 30d supply, fill #2
  Filled 2023-11-26: qty 45, 30d supply, fill #3

## 2022-12-10 ENCOUNTER — Other Ambulatory Visit (HOSPITAL_COMMUNITY): Payer: Self-pay

## 2022-12-12 ENCOUNTER — Other Ambulatory Visit (HOSPITAL_COMMUNITY): Payer: Self-pay

## 2022-12-12 ENCOUNTER — Other Ambulatory Visit: Payer: Self-pay

## 2022-12-12 MED ORDER — AMPHETAMINE-DEXTROAMPHETAMINE 20 MG PO TABS
20.0000 mg | ORAL_TABLET | Freq: Every morning | ORAL | 0 refills | Status: DC
  Filled 2022-12-12: qty 30, 30d supply, fill #0

## 2022-12-12 MED ORDER — AMPHETAMINE-DEXTROAMPHETAMINE 15 MG PO TABS
15.0000 mg | ORAL_TABLET | Freq: Every day | ORAL | 0 refills | Status: DC
  Filled 2022-12-12: qty 30, 30d supply, fill #0

## 2022-12-13 ENCOUNTER — Other Ambulatory Visit (HOSPITAL_COMMUNITY): Payer: Self-pay

## 2022-12-18 ENCOUNTER — Telehealth: Payer: Self-pay | Admitting: Pharmacist

## 2022-12-18 NOTE — Telephone Encounter (Signed)
Called patient to schedule an appointment for the [employer]. I was unable to reach the patient so I left a HIPAA-compliant message requesting that the patient return my call.   Luke Van Ausdall, PharmD, BCACP, CPP Clinical Pharmacist [facility] [phone]  

## 2022-12-29 ENCOUNTER — Other Ambulatory Visit: Payer: Self-pay

## 2022-12-30 ENCOUNTER — Ambulatory Visit: Payer: Commercial Managed Care - PPO | Attending: Internal Medicine | Admitting: Pharmacist

## 2022-12-30 ENCOUNTER — Other Ambulatory Visit: Payer: Self-pay

## 2022-12-30 DIAGNOSIS — Z79899 Other long term (current) drug therapy: Secondary | ICD-10-CM

## 2022-12-30 NOTE — Progress Notes (Signed)
   S: Patient presents for review of their specialty medication therapy.  Patient is currently taking Dupixent for atopic dermatitis. Patient is managed by Dr. Stinehelfer for this.   Adherence: denies any missed doses  Efficacy: reports that it is working well and denies any adverse effects  Dosing: 300 mg every 2 weeks  Drug-drug interactions: none  Monitoring: Local injection site reactions: denies Hypersensitivity: denies Eosinophilia: denies Ocular effects: denies S/sx of parasitic infections: denies  O:     Lab Results  Component Value Date   WBC 7.3 03/28/2019   HGB 14.4 03/28/2019   HCT 41.9 03/28/2019   MCV 94.8 03/28/2019   PLT 292 03/28/2019      Chemistry      Component Value Date/Time   NA 133 (L) 03/28/2019 2250   NA 139 03/01/2014 1131   K 3.4 (L) 03/28/2019 2250   K 4.0 03/01/2014 1131   CL 103 03/28/2019 2250   CL 108 (H) 03/01/2014 1131   CO2 21 (L) 03/28/2019 2250   CO2 23 03/01/2014 1131   BUN 9 03/28/2019 2250   BUN 5 (L) 03/01/2014 1131   CREATININE 0.62 03/28/2019 2250   CREATININE 0.60 03/01/2014 1131      Component Value Date/Time   CALCIUM 9.0 03/28/2019 2250   CALCIUM 9.0 03/01/2014 1131   ALKPHOS 39 03/28/2019 2250   ALKPHOS 81 05/22/2013 2040   AST 18 03/28/2019 2250   AST 40 (H) 05/22/2013 2040   ALT 15 03/28/2019 2250   ALT 84 (H) 05/22/2013 2040   BILITOT 0.4 03/28/2019 2250   BILITOT 0.3 05/22/2013 2040       A/P: 1. Medication review: Patient currently on Dupixent for atopic dermatitis and is tolerating it well with no adverse effects. Reviewed the medication, including the following: Dupixent is a monoclonal antibody used to decrease inflammation. Possible adverse effects include injection site reactions, ocular adverse effects, eosinophilia, vasculitis, and increased risk of parasitic infections. No recommendations for any changes.   Luke Van Ausdall, PharmD, BCACP, CPP Clinical Pharmacist [facility] [phone]          

## 2023-01-01 ENCOUNTER — Telehealth: Payer: Commercial Managed Care - PPO | Admitting: Physician Assistant

## 2023-01-01 ENCOUNTER — Other Ambulatory Visit: Payer: Self-pay

## 2023-01-01 DIAGNOSIS — J019 Acute sinusitis, unspecified: Secondary | ICD-10-CM

## 2023-01-01 DIAGNOSIS — H6501 Acute serous otitis media, right ear: Secondary | ICD-10-CM

## 2023-01-01 DIAGNOSIS — R051 Acute cough: Secondary | ICD-10-CM

## 2023-01-01 DIAGNOSIS — B9689 Other specified bacterial agents as the cause of diseases classified elsewhere: Secondary | ICD-10-CM | POA: Diagnosis not present

## 2023-01-01 MED ORDER — PREDNISONE 20 MG PO TABS
40.0000 mg | ORAL_TABLET | Freq: Every day | ORAL | 0 refills | Status: DC
Start: 2023-01-01 — End: 2024-02-05
  Filled 2023-01-01: qty 10, 5d supply, fill #0

## 2023-01-01 MED ORDER — ALBUTEROL SULFATE HFA 108 (90 BASE) MCG/ACT IN AERS
1.0000 | INHALATION_SPRAY | Freq: Four times a day (QID) | RESPIRATORY_TRACT | 0 refills | Status: AC | PRN
Start: 2023-01-01 — End: ?
  Filled 2023-01-01: qty 6.7, 25d supply, fill #0

## 2023-01-01 MED ORDER — DOXYCYCLINE HYCLATE 100 MG PO TABS
100.0000 mg | ORAL_TABLET | Freq: Two times a day (BID) | ORAL | 0 refills | Status: DC
Start: 2023-01-01 — End: 2024-02-05
  Filled 2023-01-01: qty 20, 10d supply, fill #0

## 2023-01-01 NOTE — Progress Notes (Signed)

## 2023-01-03 ENCOUNTER — Other Ambulatory Visit (HOSPITAL_COMMUNITY): Payer: Self-pay

## 2023-01-12 ENCOUNTER — Other Ambulatory Visit: Payer: Self-pay

## 2023-01-13 ENCOUNTER — Other Ambulatory Visit: Payer: Self-pay

## 2023-01-13 MED ORDER — AMPHETAMINE-DEXTROAMPHETAMINE 20 MG PO TABS
20.0000 mg | ORAL_TABLET | Freq: Every morning | ORAL | 0 refills | Status: DC
  Filled 2023-01-13: qty 30, 30d supply, fill #0

## 2023-01-13 MED ORDER — AMPHETAMINE-DEXTROAMPHETAMINE 15 MG PO TABS
15.0000 mg | ORAL_TABLET | Freq: Every day | ORAL | 0 refills | Status: DC
  Filled 2023-01-13: qty 30, 30d supply, fill #0

## 2023-01-23 ENCOUNTER — Other Ambulatory Visit (HOSPITAL_COMMUNITY): Payer: Self-pay

## 2023-02-04 ENCOUNTER — Other Ambulatory Visit: Payer: Self-pay

## 2023-02-04 MED ORDER — AIMOVIG 70 MG/ML ~~LOC~~ SOAJ
SUBCUTANEOUS | 3 refills | Status: DC
  Filled 2023-02-04: qty 3, 90d supply, fill #0
  Filled 2023-05-12 – 2023-05-13 (×2): qty 3, 90d supply, fill #1
  Filled 2023-05-14: qty 1, 30d supply, fill #1
  Filled 2023-05-15: qty 3, 90d supply, fill #1

## 2023-02-05 ENCOUNTER — Other Ambulatory Visit: Payer: Self-pay

## 2023-02-10 ENCOUNTER — Other Ambulatory Visit: Payer: Self-pay

## 2023-02-12 ENCOUNTER — Other Ambulatory Visit: Payer: Self-pay

## 2023-02-12 MED ORDER — AMPHETAMINE-DEXTROAMPHETAMINE 15 MG PO TABS
15.0000 mg | ORAL_TABLET | Freq: Every day | ORAL | 0 refills | Status: DC
  Filled 2023-02-12: qty 30, 30d supply, fill #0

## 2023-02-12 MED ORDER — AMPHETAMINE-DEXTROAMPHETAMINE 20 MG PO TABS
20.0000 mg | ORAL_TABLET | Freq: Every morning | ORAL | 0 refills | Status: DC
  Filled 2023-02-12 (×2): qty 30, 30d supply, fill #0

## 2023-02-20 ENCOUNTER — Other Ambulatory Visit (HOSPITAL_COMMUNITY): Payer: Self-pay

## 2023-02-24 ENCOUNTER — Other Ambulatory Visit (HOSPITAL_COMMUNITY): Payer: Self-pay

## 2023-02-26 ENCOUNTER — Other Ambulatory Visit (HOSPITAL_COMMUNITY): Payer: Self-pay

## 2023-03-12 ENCOUNTER — Other Ambulatory Visit: Payer: Self-pay

## 2023-03-12 ENCOUNTER — Encounter: Payer: Self-pay | Admitting: Emergency Medicine

## 2023-03-12 ENCOUNTER — Emergency Department
Admission: EM | Admit: 2023-03-12 | Discharge: 2023-03-12 | Disposition: A | Discharge status: Home / Self Care | Payer: Commercial Managed Care - PPO | Attending: Emergency Medicine | Admitting: Emergency Medicine

## 2023-03-12 DIAGNOSIS — R42 Dizziness and giddiness: Secondary | ICD-10-CM | POA: Diagnosis not present

## 2023-03-12 DIAGNOSIS — R000 Tachycardia, unspecified: Secondary | ICD-10-CM | POA: Diagnosis not present

## 2023-03-12 LAB — BASIC METABOLIC PANEL
Anion gap: 8 (ref 5–15)
BUN: 12 mg/dL (ref 6–20)
CO2: 22 mmol/L (ref 22–32)
Calcium: 8.6 mg/dL — ABNORMAL LOW (ref 8.9–10.3)
Chloride: 102 mmol/L (ref 98–111)
Creatinine, Ser: 0.53 mg/dL (ref 0.44–1.00)
GFR, Estimated: >60 mL/min (ref >60)
Glucose, Bld: 96 mg/dL (ref 70–99)
Potassium: 3.9 mmol/L (ref 3.5–5.1)
Sodium: 132 mmol/L — ABNORMAL LOW (ref 135–145)

## 2023-03-12 LAB — CBC
HCT: 45.7 % (ref 36.0–46.0)
Hemoglobin: 15 g/dL (ref 12.0–15.0)
MCH: 32.6 pg (ref 26.0–34.0)
MCHC: 32.8 g/dL (ref 30.0–36.0)
MCV: 99.3 fL (ref 80.0–100.0)
Platelets: 289 10*3/uL (ref 150–400)
RBC: 4.6 MIL/uL (ref 3.87–5.11)
RDW: 12.3 % (ref 11.5–15.5)
WBC: 6 10*3/uL (ref 4.0–10.5)
nRBC: 0 % (ref 0.0–0.2)

## 2023-03-12 LAB — PREGNANCY, URINE: Preg Test, Ur: NEGATIVE

## 2023-03-12 LAB — URINALYSIS, ROUTINE W REFLEX MICROSCOPIC
Bilirubin Urine: NEGATIVE
Glucose, UA: NEGATIVE mg/dL
Hgb urine dipstick: NEGATIVE
Ketones, ur: NEGATIVE mg/dL
Leukocytes,Ua: NEGATIVE
Nitrite: NEGATIVE
Protein, ur: NEGATIVE mg/dL
Specific Gravity, Urine: 1.011 (ref 1.005–1.030)
pH: 7 (ref 5.0–8.0)

## 2023-03-12 LAB — TROPONIN I (HIGH SENSITIVITY)
Troponin I (High Sensitivity): 4 ng/L (ref <18)
Troponin I (High Sensitivity): 3 ng/L (ref <18)

## 2023-03-12 LAB — D-DIMER, QUANTITATIVE: D-Dimer, Quant: <0.27 ug{FEU}/mL (ref 0.00–0.50)

## 2023-03-12 MED ORDER — SODIUM CHLORIDE 0.9 % IV BOLUS
1000.0000 mL | Freq: Once | INTRAVENOUS | Status: AC
  Administered 2023-03-12: 1000 mL via INTRAVENOUS

## 2023-03-12 NOTE — Discharge Instructions (Signed)
Please seek medical attention for any high fevers, chest pain, shortness of breath, change in behavior, persistent vomiting, bloody stool or any other new or concerning symptoms.  

## 2023-03-12 NOTE — ED Notes (Signed)
Lt green, purple, red, and blue top sent to lab at this time.

## 2023-03-12 NOTE — ED Provider Notes (Signed)
[facility] Provider Note    Event Date/Time   First MD Initiated Contact with Patient 03/12/23 [id number]     (approximate)   History   Near Syncope   HPI  Ashley Hancock is a 42 y.o. female who presents to the emergency department today after an episode of lightheadedness, tachycardia.  Patient was in a staff meeting when she started feeling like her heart was racing.  She started feeling lightheaded.  She checked her blood pressure and was found to be high.  Patient denies similar symptoms in the past.  States that this morning she was in her normal state of health and had her normal morning routine.  Patient denies any recent illness or fevers.     Physical Exam   Triage Vital Signs: ED Triage Vitals  Encounter Vitals Group     BP 03/12/23 0925 (!) 159/99     Systolic BP Percentile --      Diastolic BP Percentile --      Pulse Rate 03/12/23 0925 96     Resp 03/12/23 0925 18     Temp 03/12/23 0925 98.2 F (36.8 C)     Temp Source 03/12/23 0925 Oral     SpO2 03/12/23 0925 100 %     Weight 03/12/23 0924 130 lb (59 kg)     Height 03/12/23 0924 5\' 6"  (1.676 m)     Head Circumference --      Peak Flow --      Pain Score 03/12/23 0924 0     Pain Loc --      Pain Education --      Exclude from Growth Chart --     Most recent vital signs: Vitals:   03/12/23 0925  BP: (!) 159/99  Pulse: 96  Resp: 18  Temp: 98.2 F (36.8 C)  SpO2: 100%   General: Awake, alert, oriented. CV:  Good peripheral perfusion. Regular rate and rhythm. Resp:  Normal effort. Lungs clear. Abd:  No distention.   ED Results / Procedures / Treatments   Labs (all labs ordered are listed, but only abnormal results are displayed) Labs Reviewed  BASIC METABOLIC PANEL - Abnormal; Notable for the following components:      Result Value   Sodium 132 (*)    Calcium 8.6 (*)    All other components within normal limits  URINALYSIS, ROUTINE W REFLEX MICROSCOPIC - Abnormal;  Notable for the following components:   Color, Urine YELLOW (*)    APPearance CLEAR (*)    All other components within normal limits  CBC  D-DIMER, QUANTITATIVE  PREGNANCY, URINE  TROPONIN I (HIGH SENSITIVITY)  TROPONIN I (HIGH SENSITIVITY)     EKG  I, Phineas Semen, attending physician, personally viewed and interpreted this EKG  EKG Time: 0936 Rate: 82 Rhythm: sinus rhythm Axis: right axis deviation Intervals: qtc 427 QRS: narrow, q waves v1 ST changes: no st elevation Impression: abnormal ekg   RADIOLOGY None  PROCEDURES:  Critical Care performed: No  MEDICATIONS ORDERED IN ED: Medications - No data to display   IMPRESSION / MDM / ASSESSMENT AND PLAN / ED COURSE  I reviewed the triage vital signs and the nursing notes.                              Differential diagnosis includes, but is not limited to, anemia, electrolyte abnormality, arrhythmia, PE, ACS  Patient's presentation is  most consistent with acute presentation with potential threat to life or bodily function.   The patient is on the cardiac monitor to evaluate for evidence of arrhythmia and/or significant heart rate changes.  Patient presented to the emergency department today because of concerns for an episode of lightheadedness and tachycardia.  Time my exam patient is feeling slightly improved.  Blood work here without concerning anemia, electrolyte abnormality.  No concerning elevation of D-dimer.  EKG without arrhythmia.  At this time somewhat unclear etiology of the patient's symptoms however given that patient felt improved I think would be reasonable for patient be discharged home.  Discussed follow-up with primary care.      FINAL CLINICAL IMPRESSION(S) / ED DIAGNOSES   Final diagnoses:  Lightheadedness  Tachycardia      Note:  This document was prepared using Dragon voice recognition software and may include unintentional dictation errors.    Phineas Semen, MD 03/12/23  [phone]

## 2023-03-12 NOTE — ED Triage Notes (Addendum)
Pt via POV from work. Pt was sitting in a staff meeting, sudden onset of lightheadedness. States that she felt fine waking up this morning. Staff members took her vitals and she was tachycardiac and it was 160s systolic, which is abnormal for the pt. Denies any pain. Denies any headache, numbness, or vision changes. Pt is A&Ox4 and NAD

## 2023-03-17 ENCOUNTER — Other Ambulatory Visit: Payer: Self-pay

## 2023-03-17 MED ORDER — AMPHETAMINE-DEXTROAMPHETAMINE 20 MG PO TABS
20.0000 mg | ORAL_TABLET | Freq: Every morning | ORAL | 0 refills | Status: DC
  Filled 2023-03-17: qty 30, 30d supply, fill #0

## 2023-03-17 MED ORDER — AMPHETAMINE-DEXTROAMPHETAMINE 15 MG PO TABS
15.0000 mg | ORAL_TABLET | Freq: Every day | ORAL | 0 refills | Status: DC
  Filled 2023-03-17: qty 30, 30d supply, fill #0

## 2023-03-29 ENCOUNTER — Encounter (HOSPITAL_COMMUNITY): Payer: Self-pay

## 2023-03-31 DIAGNOSIS — R002 Palpitations: Secondary | ICD-10-CM | POA: Diagnosis not present

## 2023-04-01 ENCOUNTER — Other Ambulatory Visit (HOSPITAL_COMMUNITY): Payer: Self-pay

## 2023-04-03 ENCOUNTER — Other Ambulatory Visit: Payer: Self-pay

## 2023-04-07 ENCOUNTER — Other Ambulatory Visit: Payer: Self-pay

## 2023-04-09 ENCOUNTER — Other Ambulatory Visit: Payer: Self-pay

## 2023-04-09 ENCOUNTER — Encounter (HOSPITAL_COMMUNITY): Payer: Self-pay

## 2023-04-09 NOTE — Progress Notes (Signed)
Specialty Pharmacy Refill Coordination Note  Ashley Hancock is a 42 y.o. female contacted today regarding refills of specialty medication(s) Dupilumab   Patient requested Delivery   Delivery date: 04/10/23   Verified address: [address] [place] [address]   Medication will be filled on 04/09/23.

## 2023-04-09 NOTE — Progress Notes (Signed)
Specialty Pharmacy Ongoing Clinical Assessment Note  Ashley Hancock is a 42 y.o. female who is being followed by the specialty pharmacy service for RxSp Atopic Dermatitis   Patient's specialty medication(s) reviewed today: Dupilumab   Missed doses in the last 4 weeks: 0   Patient did not have any additional questions or concerns.   Therapeutic benefit summary: Patient is achieving benefit   Adverse events/side effects summary: No adverse events/side effects   Patient's therapy is appropriate to: Continue    Goals Addressed             This Visit's Progress    Minimize recurrence of flares       Patient is on track. Patient will maintain adherence         Follow up:  6 months

## 2023-04-15 ENCOUNTER — Other Ambulatory Visit: Payer: Self-pay

## 2023-04-15 MED ORDER — AMPHETAMINE-DEXTROAMPHETAMINE 20 MG PO TABS
20.0000 mg | ORAL_TABLET | Freq: Every morning | ORAL | 0 refills | Status: DC
  Filled 2023-04-15 (×2): qty 30, 30d supply, fill #0

## 2023-04-15 MED ORDER — AMPHETAMINE-DEXTROAMPHETAMINE 15 MG PO TABS
15.0000 mg | ORAL_TABLET | Freq: Every day | ORAL | 0 refills | Status: DC
  Filled 2023-04-15 (×2): qty 30, 30d supply, fill #0

## 2023-04-26 ENCOUNTER — Other Ambulatory Visit: Payer: Self-pay

## 2023-04-28 ENCOUNTER — Other Ambulatory Visit: Payer: Self-pay

## 2023-05-02 ENCOUNTER — Other Ambulatory Visit: Payer: Self-pay

## 2023-05-02 NOTE — Progress Notes (Signed)
Specialty Pharmacy Refill Coordination Note  Ashley Hancock is a 42 y.o. female contacted today regarding refills of specialty medication(s) No data recorded  Patient requested Delivery   Delivery date: 05/12/23   Verified address: [address] [place], [place] [address]   Medication will be filled on 05/12/23.   Patient is aware refrigerated item cannot be shipped over the weekend. Medication will fill 11/4 for 11/5 delivery.

## 2023-05-09 ENCOUNTER — Institutional Professional Consult (permissible substitution): Payer: Commercial Managed Care - PPO | Admitting: Plastic Surgery

## 2023-05-12 ENCOUNTER — Other Ambulatory Visit: Payer: Self-pay

## 2023-05-12 MED ORDER — AMPHETAMINE-DEXTROAMPHETAMINE 15 MG PO TABS
15.0000 mg | ORAL_TABLET | Freq: Every day | ORAL | 0 refills | Status: DC
  Filled 2023-05-12: qty 30, 30d supply, fill #0

## 2023-05-12 MED ORDER — AMPHETAMINE-DEXTROAMPHETAMINE 20 MG PO TABS
20.0000 mg | ORAL_TABLET | Freq: Every morning | ORAL | 0 refills | Status: DC
  Filled 2023-05-12: qty 30, 30d supply, fill #0

## 2023-05-13 ENCOUNTER — Other Ambulatory Visit: Payer: Self-pay

## 2023-05-14 ENCOUNTER — Other Ambulatory Visit: Payer: Self-pay

## 2023-05-15 ENCOUNTER — Other Ambulatory Visit: Payer: Self-pay

## 2023-05-15 NOTE — Progress Notes (Signed)
Pharmacy Patient Advocate Encounter   Received notification from CoverMyMeds that prior authorization for Dupixent is required/requested. Previous PA expires on 05/28/23   Insurance verification completed.   The patient is insured through [facility] .   Per test claim: PA required; PA submitted to above mentioned insurance via CoverMyMeds Key/confirmation #/EOC  [id number] Status is pending

## 2023-05-19 NOTE — Progress Notes (Signed)
Pharmacy Patient Advocate Encounter  Received notification from [facility] that Prior Authorization for Dupixent has been APPROVED from 05/29/23 to 1120/25   PA #/Case ID/Reference #:  [id number]

## 2023-05-21 ENCOUNTER — Other Ambulatory Visit: Payer: Self-pay

## 2023-06-03 ENCOUNTER — Other Ambulatory Visit: Payer: Self-pay

## 2023-06-04 ENCOUNTER — Other Ambulatory Visit: Payer: Self-pay

## 2023-06-04 MED ORDER — NURTEC 75 MG PO TBDP
75.0000 mg | ORAL_TABLET | Freq: Every day | ORAL | 5 refills | Status: DC
  Filled 2023-06-04: qty 16, 16d supply, fill #0
  Filled 2023-07-04: qty 16, 30d supply, fill #0
  Filled 2023-08-04: qty 16, 30d supply, fill #1
  Filled 2023-09-01: qty 16, 30d supply, fill #2
  Filled 2023-11-17: qty 16, 30d supply, fill #3

## 2023-06-09 ENCOUNTER — Other Ambulatory Visit: Payer: Self-pay

## 2023-06-09 MED ORDER — AMPHETAMINE-DEXTROAMPHETAMINE 15 MG PO TABS
15.0000 mg | ORAL_TABLET | Freq: Every day | ORAL | 0 refills | Status: DC
  Filled 2023-06-09: qty 30, 30d supply, fill #0

## 2023-06-09 MED ORDER — AMPHETAMINE-DEXTROAMPHETAMINE 20 MG PO TABS
20.0000 mg | ORAL_TABLET | Freq: Every morning | ORAL | 0 refills | Status: DC
  Filled 2023-06-09: qty 30, 30d supply, fill #0

## 2023-06-09 NOTE — Progress Notes (Signed)
Specialty Pharmacy Refill Coordination Note  Ashley Hancock is a 42 y.o. female contacted today regarding refills of specialty medication(s) Dupilumab   Patient requested Delivery   Delivery date: 06/12/23   Verified address: [address] [place], [address]   Medication will be filled on 06/11/23.

## 2023-06-11 ENCOUNTER — Other Ambulatory Visit: Payer: Self-pay

## 2023-06-25 ENCOUNTER — Institutional Professional Consult (permissible substitution): Payer: Commercial Managed Care - PPO | Admitting: Primary Care

## 2023-07-01 ENCOUNTER — Other Ambulatory Visit: Payer: Self-pay

## 2023-07-04 ENCOUNTER — Other Ambulatory Visit: Payer: Self-pay

## 2023-07-04 MED ORDER — AMPHETAMINE-DEXTROAMPHETAMINE 15 MG PO TABS
15.0000 mg | ORAL_TABLET | Freq: Every day | ORAL | 0 refills | Status: DC
  Filled 2023-07-07 – 2023-07-08 (×2): qty 30, 30d supply, fill #0

## 2023-07-04 MED ORDER — AMPHETAMINE-DEXTROAMPHETAMINE 20 MG PO TABS
20.0000 mg | ORAL_TABLET | Freq: Every morning | ORAL | 0 refills | Status: DC
  Filled 2023-07-07 – 2023-07-08 (×2): qty 30, 30d supply, fill #0

## 2023-07-04 NOTE — Progress Notes (Signed)
Specialty Pharmacy Refill Coordination Note  Ashley Hancock is a 42 y.o. female contacted today regarding refills of specialty medication(s) Dupilumab (Dupixent)   Patient requested (Patient-Rptd) Delivery   Delivery date:  (updated delivery date will be 1.3. pharmacy will be closed on new years day)   Verified address: (Patient-Rptd) [address] [place], [address]   Medication will be filled on 01.02.25 and patient will be notified of updated delivery date.

## 2023-07-07 ENCOUNTER — Other Ambulatory Visit: Payer: Self-pay

## 2023-07-08 ENCOUNTER — Other Ambulatory Visit: Payer: Self-pay

## 2023-07-10 ENCOUNTER — Other Ambulatory Visit: Payer: Self-pay

## 2023-07-10 ENCOUNTER — Other Ambulatory Visit (HOSPITAL_COMMUNITY): Payer: Self-pay

## 2023-07-11 ENCOUNTER — Other Ambulatory Visit: Payer: Self-pay

## 2023-07-21 ENCOUNTER — Other Ambulatory Visit: Payer: Self-pay

## 2023-07-21 MED ORDER — NURTEC 75 MG PO TBDP
75.0000 mg | ORAL_TABLET | Freq: Every day | ORAL | 5 refills | Status: DC
  Filled 2023-07-21 – 2024-02-29 (×2): qty 16, 16d supply, fill #0
  Filled 2024-03-01: qty 16, 32d supply, fill #0
  Filled 2024-04-14: qty 16, 32d supply, fill #1
  Filled 2024-05-18: qty 16, 32d supply, fill #2
  Filled 2024-06-27: qty 16, 32d supply, fill #3

## 2023-07-21 MED ORDER — AMITRIPTYLINE HCL 25 MG PO TABS
25.0000 mg | ORAL_TABLET | Freq: Every day | ORAL | 2 refills | Status: DC
  Filled 2023-07-21: qty 30, 30d supply, fill #0

## 2023-07-21 MED ORDER — AMPHETAMINE-DEXTROAMPHETAMINE 20 MG PO TABS
20.0000 mg | ORAL_TABLET | Freq: Every morning | ORAL | 0 refills | Status: DC
  Filled 2023-08-04 – 2023-08-05 (×2): qty 30, 30d supply, fill #0

## 2023-07-21 MED ORDER — AMPHETAMINE-DEXTROAMPHETAMINE 15 MG PO TABS
15.0000 mg | ORAL_TABLET | Freq: Every day | ORAL | 0 refills | Status: DC
  Filled 2023-08-04 – 2023-08-05 (×2): qty 30, 30d supply, fill #0

## 2023-07-21 MED ORDER — BACLOFEN 10 MG PO TABS
10.0000 mg | ORAL_TABLET | Freq: Two times a day (BID) | ORAL | 2 refills | Status: AC
  Filled 2023-07-21: qty 60, 30d supply, fill #0

## 2023-07-28 ENCOUNTER — Other Ambulatory Visit: Payer: Self-pay

## 2023-08-01 ENCOUNTER — Encounter (HOSPITAL_COMMUNITY): Payer: Self-pay

## 2023-08-01 ENCOUNTER — Other Ambulatory Visit (HOSPITAL_COMMUNITY): Payer: Self-pay

## 2023-08-04 ENCOUNTER — Other Ambulatory Visit: Payer: Self-pay

## 2023-08-05 ENCOUNTER — Other Ambulatory Visit: Payer: Self-pay

## 2023-08-07 ENCOUNTER — Other Ambulatory Visit: Payer: Self-pay

## 2023-08-11 DIAGNOSIS — M79641 Pain in right hand: Secondary | ICD-10-CM | POA: Diagnosis not present

## 2023-08-11 DIAGNOSIS — M25531 Pain in right wrist: Secondary | ICD-10-CM | POA: Diagnosis not present

## 2023-08-18 ENCOUNTER — Other Ambulatory Visit: Payer: Self-pay

## 2023-08-18 ENCOUNTER — Other Ambulatory Visit (HOSPITAL_COMMUNITY): Payer: Self-pay

## 2023-08-18 ENCOUNTER — Other Ambulatory Visit: Payer: Self-pay | Admitting: Pharmacist

## 2023-08-18 MED ORDER — DUPIXENT 300 MG/2ML ~~LOC~~ SOSY: PREFILLED_SYRINGE | SUBCUTANEOUS | 1 refills | Status: DC

## 2023-08-18 MED ORDER — DUPIXENT 300 MG/2ML ~~LOC~~ SOSY
PREFILLED_SYRINGE | SUBCUTANEOUS | 1 refills | Status: DC
  Filled 2023-08-18: qty 4, 28d supply, fill #0
  Filled 2023-09-09: qty 4, 28d supply, fill #1

## 2023-08-18 NOTE — Progress Notes (Signed)
 Specialty Pharmacy Refill Coordination Note  Ashley Hancock is a 43 y.o. female contacted today regarding refills of specialty medication(s) Dupilumab  (Dupixent )   Patient requested (Patient-Rptd) Delivery   Delivery date: (Patient-Rptd) 08/19/23   Verified address: (Patient-Rptd) [address],  [place], [address]   Medication will be filled on 02.10.25 or when refill is approved.   Patient will be notified via mychart that refill request is pending and their delivery will likely be delayed.

## 2023-08-19 ENCOUNTER — Other Ambulatory Visit (HOSPITAL_COMMUNITY): Payer: Self-pay

## 2023-08-19 ENCOUNTER — Other Ambulatory Visit: Payer: Self-pay

## 2023-08-19 DIAGNOSIS — S63501A Unspecified sprain of right wrist, initial encounter: Secondary | ICD-10-CM | POA: Diagnosis not present

## 2023-08-19 DIAGNOSIS — S63501D Unspecified sprain of right wrist, subsequent encounter: Secondary | ICD-10-CM | POA: Diagnosis not present

## 2023-09-01 ENCOUNTER — Other Ambulatory Visit: Payer: Self-pay

## 2023-09-01 MED ORDER — AMPHETAMINE-DEXTROAMPHETAMINE 15 MG PO TABS
15.0000 mg | ORAL_TABLET | Freq: Every day | ORAL | 0 refills | Status: DC
  Filled 2023-09-01: qty 30, 30d supply, fill #0

## 2023-09-01 MED ORDER — BUTALBITAL-APAP-CAFFEINE 50-325-40 MG PO TABS
1.0000 | ORAL_TABLET | ORAL | 1 refills | Status: AC | PRN
Start: 2023-09-01 — End: ?
  Filled 2023-09-01: qty 90, 15d supply, fill #0

## 2023-09-01 MED ORDER — ALBUTEROL SULFATE HFA 108 (90 BASE) MCG/ACT IN AERS
1.0000 | INHALATION_SPRAY | RESPIRATORY_TRACT | 0 refills | Status: DC | PRN
  Filled 2023-09-01: qty 6.7, 30d supply, fill #0

## 2023-09-01 MED ORDER — AMITRIPTYLINE HCL 25 MG PO TABS
25.0000 mg | ORAL_TABLET | Freq: Every day | ORAL | 2 refills | Status: DC
  Filled 2023-09-01: qty 30, 30d supply, fill #0

## 2023-09-01 MED ORDER — COLESTIPOL HCL 1 G PO TABS
2.0000 g | ORAL_TABLET | Freq: Three times a day (TID) | ORAL | 3 refills | Status: AC
  Filled 2023-09-01: qty 180, 30d supply, fill #0
  Filled 2024-05-27: qty 180, 30d supply, fill #1

## 2023-09-01 MED ORDER — AMPHETAMINE-DEXTROAMPHETAMINE 20 MG PO TABS
20.0000 mg | ORAL_TABLET | Freq: Every day | ORAL | 0 refills | Status: DC
  Filled 2023-09-01 – 2023-09-02 (×3): qty 30, 30d supply, fill #0

## 2023-09-02 ENCOUNTER — Other Ambulatory Visit: Payer: Self-pay

## 2023-09-02 DIAGNOSIS — H66003 Acute suppurative otitis media without spontaneous rupture of ear drum, bilateral: Secondary | ICD-10-CM | POA: Diagnosis not present

## 2023-09-02 MED ORDER — CIPROFLOXACIN-DEXAMETHASONE 0.3-0.1 % OT SUSP
4.0000 [drp] | Freq: Two times a day (BID) | OTIC | 0 refills | Status: AC
  Filled 2023-09-02: qty 7.5, 10d supply, fill #0

## 2023-09-02 MED ORDER — PREDNISONE 10 MG PO TABS
ORAL_TABLET | ORAL | 0 refills | Status: AC
  Filled 2023-09-02: qty 21, 6d supply, fill #0

## 2023-09-02 MED ORDER — HYDROCODONE-ACETAMINOPHEN 5-325 MG PO TABS
1.0000 | ORAL_TABLET | ORAL | 0 refills | Status: DC | PRN
Start: 2023-09-02 — End: 2024-02-05
  Filled 2023-09-02: qty 30, 3d supply, fill #0

## 2023-09-02 MED ORDER — AMOXICILLIN-POT CLAVULANATE 875-125 MG PO TABS
1.0000 | ORAL_TABLET | Freq: Two times a day (BID) | ORAL | 0 refills | Status: DC
  Filled 2023-09-02: qty 28, 14d supply, fill #0

## 2023-09-03 ENCOUNTER — Other Ambulatory Visit: Payer: Self-pay

## 2023-09-03 MED ORDER — AMOXICILLIN-POT CLAVULANATE 875-125 MG PO TABS
875.0000 mg | ORAL_TABLET | Freq: Two times a day (BID) | ORAL | 0 refills | Status: DC
  Filled 2023-09-03 – 2023-11-17 (×2): qty 10, 5d supply, fill #0

## 2023-09-09 ENCOUNTER — Other Ambulatory Visit: Payer: Self-pay | Admitting: Pharmacy Technician

## 2023-09-09 ENCOUNTER — Other Ambulatory Visit: Payer: Self-pay

## 2023-09-09 NOTE — Progress Notes (Signed)
 Specialty Pharmacy Refill Coordination Note  Ashley Hancock is a 43 y.o. female contacted today regarding refills of specialty medication(s)  Dupilumab (Dupixent)    Patient requested (Patient-Rptd) Delivery   Delivery date: (Patient-Rptd) 09/15/23   Verified address: (Patient-Rptd) [address] [place] [address]   Medication will be filled on 09/15/23 for 09/16/23.

## 2023-09-10 ENCOUNTER — Other Ambulatory Visit (HOSPITAL_COMMUNITY): Payer: Self-pay

## 2023-09-12 ENCOUNTER — Other Ambulatory Visit: Payer: Self-pay

## 2023-09-30 ENCOUNTER — Other Ambulatory Visit: Payer: Self-pay

## 2023-10-01 ENCOUNTER — Other Ambulatory Visit: Payer: Self-pay

## 2023-10-01 MED ORDER — AMPHETAMINE-DEXTROAMPHETAMINE 15 MG PO TABS
15.0000 mg | ORAL_TABLET | Freq: Every day | ORAL | 0 refills | Status: DC
  Filled 2023-10-01: qty 30, 30d supply, fill #0

## 2023-10-01 MED ORDER — AMPHETAMINE-DEXTROAMPHETAMINE 20 MG PO TABS
20.0000 mg | ORAL_TABLET | Freq: Every morning | ORAL | 0 refills | Status: DC
  Filled 2023-10-01: qty 30, 30d supply, fill #0

## 2023-10-13 ENCOUNTER — Other Ambulatory Visit (HOSPITAL_COMMUNITY): Payer: Self-pay

## 2023-10-20 ENCOUNTER — Other Ambulatory Visit (HOSPITAL_COMMUNITY): Payer: Self-pay

## 2023-10-28 ENCOUNTER — Other Ambulatory Visit: Payer: Self-pay

## 2023-10-28 MED ORDER — AMPHETAMINE-DEXTROAMPHETAMINE 15 MG PO TABS
15.0000 mg | ORAL_TABLET | Freq: Every day | ORAL | 0 refills | Status: DC
  Filled 2023-10-30: qty 30, 30d supply, fill #0
  Filled ????-??-??: fill #0

## 2023-10-28 MED ORDER — AMPHETAMINE-DEXTROAMPHETAMINE 20 MG PO TABS
20.0000 mg | ORAL_TABLET | Freq: Every morning | ORAL | 0 refills | Status: DC
  Filled 2023-10-30: qty 30, 30d supply, fill #0
  Filled ????-??-??: fill #0

## 2023-10-30 ENCOUNTER — Other Ambulatory Visit: Payer: Self-pay

## 2023-11-03 ENCOUNTER — Other Ambulatory Visit: Payer: Self-pay

## 2023-11-03 ENCOUNTER — Other Ambulatory Visit (HOSPITAL_COMMUNITY): Payer: Self-pay

## 2023-11-03 ENCOUNTER — Other Ambulatory Visit: Payer: Self-pay | Admitting: Internal Medicine

## 2023-11-03 NOTE — Progress Notes (Signed)
 Specialty Pharmacy Refill Coordination Note  Ashley Hancock is a 43 y.o. female contacted today regarding refills of specialty medication(s) Dupilumab  (Dupixent )   Patient requested Delivery   Delivery date: 11/05/23   Verified address: [address]   [place]  [place] [address]   Medication will be filled on 11/04/23.   This fill date is pending response to refill request from provider. Patient is aware and if they have not received fill by intended date, they must follow up with pharmacy.

## 2023-11-03 NOTE — Progress Notes (Signed)
 Specialty Pharmacy Ongoing Clinical Assessment Note  Ashley Hancock is a 43 y.o. female who is being followed by the specialty pharmacy service for RxSp Atopic Dermatitis   Patient's specialty medication(s) reviewed today: Dupilumab  (Dupixent )   Missed doses in the last 4 weeks: 0   Patient/Caregiver did not have any additional questions or concerns.   Therapeutic benefit summary: Patient is achieving benefit   Adverse events/side effects summary: No adverse events/side effects   Patient's therapy is appropriate to: Continue    Goals Addressed             This Visit's Progress    Minimize recurrence of flares   On track    Patient is on track. Patient will maintain adherence         Follow up:  6 months  Ashley Hancock Specialty Pharmacist

## 2023-11-04 ENCOUNTER — Other Ambulatory Visit (HOSPITAL_COMMUNITY): Payer: Self-pay

## 2023-11-04 NOTE — Progress Notes (Signed)
 Per Crystal at [facility], Patient needs an appointment. MDO will reach out to patient to let her know. I also left a voicemail.

## 2023-11-12 ENCOUNTER — Other Ambulatory Visit: Payer: Self-pay | Admitting: Pharmacist

## 2023-11-12 ENCOUNTER — Other Ambulatory Visit: Payer: Self-pay

## 2023-11-12 MED ORDER — DUPIXENT 300 MG/2ML ~~LOC~~ SOAJ: SUBCUTANEOUS | 11 refills | Status: DC

## 2023-11-12 MED ORDER — DUPIXENT 300 MG/2ML ~~LOC~~ SOAJ
SUBCUTANEOUS | 11 refills | Status: AC
  Filled 2023-11-12: qty 4, 28d supply, fill #0
  Filled 2023-12-03: qty 4, 28d supply, fill #1
  Filled 2023-12-30: qty 4, 28d supply, fill #2
  Filled 2024-01-23: qty 4, 28d supply, fill #3
  Filled 2024-02-18 (×2): qty 4, 28d supply, fill #4
  Filled 2024-03-22: qty 4, 28d supply, fill #5
  Filled 2024-04-14: qty 4, 28d supply, fill #6
  Filled 2024-05-13: qty 4, 28d supply, fill #7
  Filled 2024-06-17: qty 4, 28d supply, fill #8
  Filled 2024-07-14 – 2024-08-11 (×3): qty 4, 28d supply, fill #9

## 2023-11-17 ENCOUNTER — Other Ambulatory Visit: Payer: Self-pay

## 2023-11-17 MED ORDER — ALBUTEROL SULFATE HFA 108 (90 BASE) MCG/ACT IN AERS
1.0000 | INHALATION_SPRAY | RESPIRATORY_TRACT | 1 refills | Status: AC | PRN
  Filled 2023-11-17: qty 6.7, 30d supply, fill #0

## 2023-11-24 ENCOUNTER — Other Ambulatory Visit: Payer: Self-pay

## 2023-11-26 ENCOUNTER — Other Ambulatory Visit: Payer: Self-pay

## 2023-11-26 MED ORDER — AMPHETAMINE-DEXTROAMPHETAMINE 20 MG PO TABS
20.0000 mg | ORAL_TABLET | Freq: Every morning | ORAL | 0 refills | Status: DC
  Filled 2023-11-27 – 2023-11-28 (×2): qty 30, 30d supply, fill #0

## 2023-11-27 ENCOUNTER — Other Ambulatory Visit: Payer: Self-pay

## 2023-11-28 ENCOUNTER — Other Ambulatory Visit: Payer: Self-pay

## 2023-11-28 MED ORDER — AMPHETAMINE-DEXTROAMPHETAMINE 15 MG PO TABS
15.0000 mg | ORAL_TABLET | Freq: Every day | ORAL | 0 refills | Status: DC
  Filled ????-??-??: fill #0

## 2023-11-28 MED ORDER — AMPHETAMINE-DEXTROAMPHETAMINE 15 MG PO TABS
15.0000 mg | ORAL_TABLET | Freq: Every day | ORAL | 0 refills | Status: DC
  Filled 2023-11-28: qty 30, 30d supply, fill #0

## 2023-12-02 ENCOUNTER — Other Ambulatory Visit (HOSPITAL_COMMUNITY): Payer: Self-pay

## 2023-12-03 ENCOUNTER — Other Ambulatory Visit: Payer: Self-pay

## 2023-12-03 NOTE — Progress Notes (Signed)
 Specialty Pharmacy Refill Coordination Note  Ashley Hancock is a 43 y.o. female contacted today regarding refills of specialty medication(s) Dupilumab  (Dupixent )   Patient requested (Patient-Rptd) Delivery   Delivery date: (Patient-Rptd) 12/05/23   Verified address: (Patient-Rptd) [address]   Medication will be filled on 05.29.25.

## 2023-12-04 ENCOUNTER — Other Ambulatory Visit: Payer: Self-pay

## 2023-12-25 ENCOUNTER — Other Ambulatory Visit: Payer: Self-pay

## 2023-12-25 MED ORDER — AMPHETAMINE-DEXTROAMPHETAMINE 15 MG PO TABS
15.0000 mg | ORAL_TABLET | Freq: Every day | ORAL | 0 refills | Status: DC
  Filled 2023-12-26 – 2023-12-29 (×2): qty 30, 30d supply, fill #0

## 2023-12-25 MED ORDER — AMPHETAMINE-DEXTROAMPHETAMINE 20 MG PO TABS
20.0000 mg | ORAL_TABLET | Freq: Every morning | ORAL | 0 refills | Status: DC
  Filled 2023-12-26 – 2023-12-29 (×2): qty 30, 30d supply, fill #0

## 2023-12-26 ENCOUNTER — Other Ambulatory Visit: Payer: Self-pay

## 2023-12-29 ENCOUNTER — Other Ambulatory Visit (HOSPITAL_COMMUNITY): Payer: Self-pay

## 2023-12-29 ENCOUNTER — Other Ambulatory Visit: Payer: Self-pay

## 2023-12-30 ENCOUNTER — Encounter (INDEPENDENT_AMBULATORY_CARE_PROVIDER_SITE_OTHER): Payer: Self-pay

## 2023-12-30 ENCOUNTER — Other Ambulatory Visit: Payer: Self-pay

## 2023-12-30 ENCOUNTER — Telehealth: Payer: Self-pay | Admitting: Pharmacist

## 2023-12-30 NOTE — Progress Notes (Signed)
 Specialty Pharmacy Refill Coordination Note  Ashley Hancock is a 43 y.o. female contacted today regarding refills of specialty medication(s) Dupilumab  (Dupixent )   Patient requested Delivery   Delivery date: 12/31/23   Verified address: (Patient-Rptd) [address]   Medication will be filled on 06.24.25.

## 2023-12-30 NOTE — Telephone Encounter (Signed)
 Called patient to schedule an appointment for the [facility] [employer] Special[facility]ication [facility]. I was unable to reach the patient so I left a HIPAA-compliant message requesting that the patient return my call.   Butch Penny, PharmD, P[place]y [place], CPP Clinical Pharmacist [facility] [phone]

## 2023-12-31 ENCOUNTER — Other Ambulatory Visit (HOSPITAL_COMMUNITY): Payer: Self-pay

## 2024-01-12 ENCOUNTER — Other Ambulatory Visit: Payer: Self-pay

## 2024-01-12 DIAGNOSIS — Z13220 Encounter for screening for lipoid disorders: Secondary | ICD-10-CM | POA: Diagnosis not present

## 2024-01-12 DIAGNOSIS — Z1389 Encounter for screening for other disorder: Secondary | ICD-10-CM | POA: Diagnosis not present

## 2024-01-12 DIAGNOSIS — N951 Menopausal and female climacteric states: Secondary | ICD-10-CM | POA: Diagnosis not present

## 2024-01-12 DIAGNOSIS — R41840 Attention and concentration deficit: Secondary | ICD-10-CM | POA: Diagnosis not present

## 2024-01-12 DIAGNOSIS — Z1321 Encounter for screening for nutritional disorder: Secondary | ICD-10-CM | POA: Diagnosis not present

## 2024-01-12 DIAGNOSIS — Z131 Encounter for screening for diabetes mellitus: Secondary | ICD-10-CM | POA: Diagnosis not present

## 2024-01-12 DIAGNOSIS — G43109 Migraine with aura, not intractable, without status migrainosus: Secondary | ICD-10-CM | POA: Diagnosis not present

## 2024-01-12 DIAGNOSIS — Z0000 Encounter for general adult medical examination without abnormal findings: Secondary | ICD-10-CM | POA: Diagnosis not present

## 2024-01-12 DIAGNOSIS — K915 Postcholecystectomy syndrome: Secondary | ICD-10-CM | POA: Diagnosis not present

## 2024-01-12 DIAGNOSIS — R0602 Shortness of breath: Secondary | ICD-10-CM | POA: Diagnosis not present

## 2024-01-12 DIAGNOSIS — Z1329 Encounter for screening for other suspected endocrine disorder: Secondary | ICD-10-CM | POA: Diagnosis not present

## 2024-01-12 MED ORDER — AMPHETAMINE-DEXTROAMPHETAMINE 15 MG PO TABS
15.0000 mg | ORAL_TABLET | Freq: Every day | ORAL | 0 refills | Status: DC
  Filled 2024-01-23 – 2024-01-28 (×5): qty 30, 30d supply, fill #0

## 2024-01-12 MED ORDER — AMPHETAMINE-DEXTROAMPHETAMINE 20 MG PO TABS
20.0000 mg | ORAL_TABLET | Freq: Every morning | ORAL | 0 refills | Status: DC
  Filled 2024-01-23 – 2024-01-28 (×5): qty 30, 30d supply, fill #0

## 2024-01-23 ENCOUNTER — Other Ambulatory Visit: Payer: Self-pay

## 2024-01-23 ENCOUNTER — Other Ambulatory Visit (HOSPITAL_COMMUNITY): Payer: Self-pay

## 2024-01-23 ENCOUNTER — Encounter (INDEPENDENT_AMBULATORY_CARE_PROVIDER_SITE_OTHER): Payer: Self-pay

## 2024-01-23 NOTE — Progress Notes (Signed)
 Specialty Pharmacy Refill Coordination Note  MyChart Questionnaire Submission  SKI POLICH is a 43 y.o. female contacted today regarding refills of specialty medication(s) Dupixent .  Injection date: (Patient-Rptd) 02/02/24  Doses on hand: (Patient-Rptd) 0   Patient requested: (Patient-Rptd) Delivery   Delivery date: 01/27/24   Verified address: (Patient-Rptd) [address] [place], [address]  Medication will be filled on 01/26/24.

## 2024-01-26 ENCOUNTER — Other Ambulatory Visit: Payer: Self-pay

## 2024-01-28 ENCOUNTER — Other Ambulatory Visit: Payer: Self-pay

## 2024-02-05 ENCOUNTER — Encounter: Payer: Self-pay | Admitting: Neurosurgery

## 2024-02-05 ENCOUNTER — Ambulatory Visit: Admitting: Neurosurgery

## 2024-02-05 VITALS — BP 156/88 | Ht 66.0 in | Wt 130.0 lb

## 2024-02-05 DIAGNOSIS — M79601 Pain in right arm: Secondary | ICD-10-CM | POA: Diagnosis not present

## 2024-02-05 DIAGNOSIS — M542 Cervicalgia: Secondary | ICD-10-CM

## 2024-02-05 DIAGNOSIS — M7918 Myalgia, other site: Secondary | ICD-10-CM | POA: Diagnosis not present

## 2024-02-05 DIAGNOSIS — M5412 Radiculopathy, cervical region: Secondary | ICD-10-CM

## 2024-02-05 MED ORDER — METHYLPREDNISOLONE 4 MG PO TBPK: ORAL_TABLET | ORAL | 0 refills | Status: AC

## 2024-02-05 MED ORDER — BACLOFEN 10 MG PO TABS: 10.0000 mg | ORAL_TABLET | Freq: Three times a day (TID) | ORAL | 0 refills | Status: AC | PRN

## 2024-02-05 NOTE — Progress Notes (Signed)
 Referring Physician:  No referring provider defined for this encounter.  Primary Physician:  Janifer Colander (Inactive)  History of Present Illness: 02/05/2024 Ashley Hancock presents with 3 days of worsening pain into the right side of her neck, inside of her right shoulder blade towards the bottom of her shoulder blade, underneath her arm, and down her arm to her 4th and 5th digit.  She has numbness and tingling.  This is causing her substantial pain.  She took a baclofen  last night which helped, but has not had an opportunity to pursue any further workup at this time.   02/13/2022 Ashley Hancock is here today with a chief complaint of neck, shoulder blade, and arm discomfort.  She had a previous bout of similar pain in her neck and R shoulder blade several years ago.  She had trigger point injections at the time, which helped immensely.  She has been doing well until recently when she developed pain in her neck (primarily R side) and into her R shoulder blade.  The pain is worse with activity.  Nothing has helped significantly.  She is also having numbness in both pinkies extending up her forearm on the ulnar side.  She expressed waking up in the middle of the night with numbness in her R hand when she sleeps on her stomach.   Conservative measures:  Physical therapy: none in past 12 months  Multimodal medical therapy including regular antiinflammatories: none  Injections: no epidural steroid injections  Past Surgery: none  Ashley Hancock has no symptoms of cervical myelopathy.  The symptoms are causing a significant impact on the patient's life.   Review of Systems:  A 10 point review of systems is negative, except for the pertinent positives and negatives detailed in the HPI.  Past Medical History: Past Medical History:  Diagnosis Date   Anxiety    Blood transfusion without reported diagnosis    1unit post laparotomy 2015   Dysrhythmia    irreg HR and PVCs   Heart  murmur    History of gastric ulcer    IBS (irritable bowel syndrome)    Migraines    Motion sickness    PONV (postoperative nausea and vomiting)    nausea only    Past Surgical History: Past Surgical History:  Procedure Laterality Date   APPENDECTOMY     AUGMENTATION MAMMAPLASTY Bilateral 2018   BIOPSY N/A 04/05/2019   Procedure: BIOPSY;  Surgeon: Jinny Carmine, MD;  Location: [facility];  Service: Endoscopy;  Laterality: N/A;   CHOLECYSTECTOMY  2011   COLONOSCOPY WITH PROPOFOL  N/A 04/05/2019   Procedure: COLONOSCOPY WITH PROPOFOL ;  Surgeon: Jinny Carmine, MD;  Location: [facility];  Service: Endoscopy;  Laterality: N/A;   ESOPHAGOGASTRODUODENOSCOPY (EGD) WITH PROPOFOL  N/A 04/05/2019   Procedure: ESOPHAGOGASTRODUODENOSCOPY (EGD) WITH PROPOFOL ;  Surgeon: Jinny Carmine, MD;  Location: [facility];  Service: Endoscopy;  Laterality: N/A;   HEMORRHOID SURGERY N/A 12/18/2015   Procedure:  I & D THROMBUS HEMORRHOID;  Surgeon: Laneta JULIANNA Luna, MD;  Location: [facility];  Service: General;  Laterality: N/A;   LAPAROSCOPY     LAPAROTOMY     x2   RHINOPLASTY  2012   WISDOM TOOTH EXTRACTION      Allergies: Allergies as of 02/05/2024 - Review Complete 02/05/2024  Allergen Reaction Noted   Bacitracin Rash 12/18/2015    Medications: Current Meds  Medication Sig   albuterol  (VENTOLIN  HFA) 108 (90 Base) MCG/ACT inhaler Inhale 1-2 puffs into  the lungs every 6 (six) hours as needed.   albuterol  (VENTOLIN  HFA) 108 (90 Base) MCG/ACT inhaler Inhale 1 puff into the lungs every 4 (four) hours as needed.   amphetamine -dextroamphetamine  (ADDERALL) 20 MG tablet Take 1 tablet (20 mg total) by mouth in the morning.   baclofen  (LIORESAL ) 10 MG tablet Take 1 tablet (10 mg total) by mouth 2 (two) times daily.   baclofen  (LIORESAL ) 10 MG tablet Take 1 tablet (10 mg total) by mouth 3 (three) times daily as needed for muscle spasms.   butalbital -acetaminophen -caffeine  (FIORICET ) 50-325-40 MG  tablet Take 1 tablet by mouth every 4 (four) hours as needed.   colestipol  (COLESTID ) 1 g tablet Take 2 tablets (2 g total) by mouth 3 (three) times daily.   Dupilumab  (DUPIXENT ) 300 MG/2ML SOAJ Inject 1 pen injector subcutaneously every two weeks   hydroquinone  4 % cream Apply 1 Application topically 2 (two) times daily.   ibuprofen  (ADVIL ) 200 MG tablet Take 200 mg by mouth every 6 (six) hours as needed for headache.   levonorgestrel  (MIRENA ) 20 MCG/24HR IUD 1 each by Intrauterine route once.   methylPREDNISolone  (MEDROL  DOSEPAK) 4 MG TBPK tablet Take as directed on box   Rimegepant Sulfate  (NURTEC) 75 MG TBDP Take 1 tablet (75 mg total) on the tongue and allow to dissolve once a day as directed.   traMADol (ULTRAM) 50 MG tablet TAKE ONE TAB BY MOUTH EVERY 6 HOURS HOURS AS NEEDED PAIN   tretinoin  (ALTRALIN) 0.05 % gel Apply 1 Application topically at bedtime for scalp   tretinoin  (RETIN-A ) 0.05 % cream Apply 1 a small amount to skin every night    Social History: Social History   Tobacco Use   Smoking status: Every Day    Current packs/day: 0.50    Average packs/day: 0.5 packs/day for 20.0 years (10.0 ttl pk-yrs)    Types: Cigarettes   Smokeless tobacco: Never   Tobacco comments:    started age 40  Vaping Use   Vaping status: Some Days   Substances: Nicotine   Devices: Juul  Substance Use Topics   Alcohol use: Yes    Alcohol/week: 8.0 standard drinks of alcohol    Types: 8 Glasses of wine per week   Drug use: No    Family Medical History: Family History  Problem Relation Age of Onset   Hypertension Mother    Thyroid  disease Mother    Bipolar disorder Mother    Heart disease Father        CHF;MI   Diabetes Father    Breast cancer Neg Hx     Physical Examination: Vitals:   02/05/24 1157  BP: (!) 156/88   In mild distress due to pain.  Breathing comfortably.  Neck is supple.  Range of motion shows diminished extension and rotation to the right.  She has 5 out of  5 strength throughout her bilateral upper and lower extremities.  Sensation is intact to light touch and pinprick.  Reflexes 1+ throughout.  Hoffmann's is absent.  Gait is normal.    Medical Decision Making  Imaging: 05/23/2022 Disc levels:   C2-3: No spinal canal neural stenosis.   C3-4: Spinal canal neural foraminal stenosis.   C4-5: Tiny posterior disc protrusion causing minimal indentation of the thecal sac. No significant spinal canal or neural foraminal stenosis.   C5-6: Tiny posterior disc protrusion causing small indentation of the thecal sac. No significant spinal canal or neural foraminal stenosis.   C6-7: No spinal canal or neural  foraminal stenosis.   C7-T1: No spinal canal or neural foraminal stenosis.   IMPRESSION: 1. Minimal degenerative disc disease at C4-5 and C5-6. 2. No spinal canal or neural foraminal stenosis at any level.     Electronically Signed   By: Katyucia  de Macedo Rodrigues M.D.   On: 05/24/2022 08:54  I have personally reviewed the images and agree with the above interpretation.  Assessment and Plan: Ashley Hancock is a pleasant 43 y.o. female with cervicalgia, shoulder blade pain (possibly radicular), R arm pain, or possible ulnar neuropathy.  She previously had excellent results with trigger point injections.   I will start her on a steroid taper.  She will see Benton Pinal for trigger point injections.  I have asked her to contact me midweek to see how she is doing.  If she is improving at that time, we will continue to monitor.  If she is having continued symptoms, I will send her for imaging.  I have given her exercises for her neck.  I spent a total of 15 minutes in face-to-face and non-face-to-face activities related to this patient's care today.  Thank you for involving me in the care of this patient.      Maryhelen Lindler K. Clois MD, [facility] Neurosurgery

## 2024-02-18 ENCOUNTER — Other Ambulatory Visit: Payer: Self-pay

## 2024-02-18 ENCOUNTER — Other Ambulatory Visit: Payer: Self-pay | Admitting: Pharmacy Technician

## 2024-02-18 NOTE — Progress Notes (Signed)
 Specialty Pharmacy Refill Coordination Note  Ashley Hancock is a 43 y.o. female contacted today regarding refills of specialty medication(s) Dupilumab  (Dupixent )   Patient requested Delivery   Delivery date: 02/26/24   Verified address: [address]   [place] [address]   Medication will be filled on 02/25/24.   Patient requested med to be ship to [address] [place], [place], [place] [address] this time due to leaving town.  Inj on 8/25.

## 2024-02-25 ENCOUNTER — Other Ambulatory Visit: Payer: Self-pay

## 2024-03-01 ENCOUNTER — Other Ambulatory Visit: Payer: Self-pay

## 2024-03-01 MED ORDER — AMPHETAMINE-DEXTROAMPHETAMINE 20 MG PO TABS
20.0000 mg | ORAL_TABLET | Freq: Every morning | ORAL | 0 refills | Status: DC
  Filled 2024-03-01: qty 30, 30d supply, fill #0

## 2024-03-01 MED ORDER — BUTALBITAL-APAP-CAFFEINE 50-325-40 MG PO TABS
1.0000 | ORAL_TABLET | ORAL | 0 refills | Status: DC | PRN
  Filled 2024-03-01: qty 15, 3d supply, fill #0

## 2024-03-01 MED ORDER — AMPHETAMINE-DEXTROAMPHETAMINE 15 MG PO TABS
15.0000 mg | ORAL_TABLET | Freq: Every day | ORAL | 0 refills | Status: DC
  Filled 2024-03-01: qty 30, 30d supply, fill #0

## 2024-03-10 DIAGNOSIS — Z1231 Encounter for screening mammogram for malignant neoplasm of breast: Secondary | ICD-10-CM | POA: Diagnosis not present

## 2024-03-16 ENCOUNTER — Other Ambulatory Visit: Payer: Self-pay | Admitting: Obstetrics and Gynecology

## 2024-03-16 DIAGNOSIS — R928 Other abnormal and inconclusive findings on diagnostic imaging of breast: Secondary | ICD-10-CM

## 2024-03-17 ENCOUNTER — Other Ambulatory Visit (HOSPITAL_COMMUNITY): Payer: Self-pay

## 2024-03-22 ENCOUNTER — Other Ambulatory Visit: Payer: Self-pay

## 2024-03-24 ENCOUNTER — Other Ambulatory Visit: Payer: Self-pay

## 2024-03-24 NOTE — Progress Notes (Signed)
 Specialty Pharmacy Refill Coordination Note  Ashley Hancock is a 43 y.o. female contacted today regarding refills of specialty medication(s) Dupilumab  (Dupixent )   Patient requested Delivery   Delivery date: 03/25/24   Verified address: [address]   [place] [address]   Medication will be filled on 09.17.25.

## 2024-03-26 ENCOUNTER — Ambulatory Visit

## 2024-03-26 ENCOUNTER — Ambulatory Visit
Admission: RE | Admit: 2024-03-26 | Discharge: 2024-03-26 | Disposition: A | Discharge status: Home / Self Care | Source: Ambulatory Visit | Attending: Obstetrics and Gynecology | Admitting: Obstetrics and Gynecology

## 2024-03-26 DIAGNOSIS — R928 Other abnormal and inconclusive findings on diagnostic imaging of breast: Secondary | ICD-10-CM

## 2024-03-26 DIAGNOSIS — R92321 Mammographic fibroglandular density, right breast: Secondary | ICD-10-CM | POA: Diagnosis not present

## 2024-03-31 ENCOUNTER — Other Ambulatory Visit: Payer: Self-pay

## 2024-03-31 MED ORDER — AMPHETAMINE-DEXTROAMPHETAMINE 15 MG PO TABS
15.0000 mg | ORAL_TABLET | Freq: Every day | ORAL | 0 refills | Status: DC
  Filled 2024-03-31: qty 30, 30d supply, fill #0

## 2024-03-31 MED ORDER — AMPHETAMINE-DEXTROAMPHETAMINE 20 MG PO TABS
20.0000 mg | ORAL_TABLET | Freq: Every day | ORAL | 0 refills | Status: DC
  Filled 2024-03-31: qty 30, 30d supply, fill #0

## 2024-04-12 DIAGNOSIS — K915 Postcholecystectomy syndrome: Secondary | ICD-10-CM | POA: Diagnosis not present

## 2024-04-12 DIAGNOSIS — G43109 Migraine with aura, not intractable, without status migrainosus: Secondary | ICD-10-CM | POA: Diagnosis not present

## 2024-04-12 DIAGNOSIS — Z0000 Encounter for general adult medical examination without abnormal findings: Secondary | ICD-10-CM | POA: Diagnosis not present

## 2024-04-12 DIAGNOSIS — Z13220 Encounter for screening for lipoid disorders: Secondary | ICD-10-CM | POA: Diagnosis not present

## 2024-04-14 ENCOUNTER — Other Ambulatory Visit: Payer: Self-pay

## 2024-04-14 ENCOUNTER — Encounter (INDEPENDENT_AMBULATORY_CARE_PROVIDER_SITE_OTHER): Payer: Self-pay

## 2024-04-14 ENCOUNTER — Other Ambulatory Visit (HOSPITAL_COMMUNITY): Payer: Self-pay

## 2024-04-14 NOTE — Progress Notes (Signed)
 Specialty Pharmacy Refill Coordination Note  MyChart Questionnaire Submission  Ashley Hancock is a 43 y.o. female contacted today regarding refills of specialty medication(s) Dupixent .  Doses on hand: (Patient-Rptd) 0   Injection date: (Patient-Rptd) 04/26/24  Patient requested: (Patient-Rptd) Delivery   Delivery date: 04/16/24  Verified address: [address] [place] [address]  Medication will be filled on 04/15/24.

## 2024-04-15 ENCOUNTER — Other Ambulatory Visit: Payer: Self-pay

## 2024-04-28 ENCOUNTER — Other Ambulatory Visit: Payer: Self-pay

## 2024-04-28 MED ORDER — AMPHETAMINE-DEXTROAMPHETAMINE 20 MG PO TABS
20.0000 mg | ORAL_TABLET | Freq: Every morning | ORAL | 0 refills | Status: DC
  Filled 2024-04-29: qty 30, 30d supply, fill #0

## 2024-04-28 MED ORDER — AMPHETAMINE-DEXTROAMPHETAMINE 15 MG PO TABS
15.0000 mg | ORAL_TABLET | Freq: Every day | ORAL | 0 refills | Status: DC
  Filled 2024-04-29: qty 30, 30d supply, fill #0

## 2024-04-29 ENCOUNTER — Other Ambulatory Visit: Payer: Self-pay

## 2024-05-13 ENCOUNTER — Other Ambulatory Visit (HOSPITAL_BASED_OUTPATIENT_CLINIC_OR_DEPARTMENT_OTHER): Payer: Self-pay

## 2024-05-13 ENCOUNTER — Other Ambulatory Visit: Payer: Self-pay

## 2024-05-15 ENCOUNTER — Other Ambulatory Visit (HOSPITAL_COMMUNITY): Payer: Self-pay

## 2024-05-18 ENCOUNTER — Other Ambulatory Visit (HOSPITAL_COMMUNITY): Payer: Self-pay

## 2024-05-18 ENCOUNTER — Other Ambulatory Visit: Payer: Self-pay

## 2024-05-18 ENCOUNTER — Encounter (INDEPENDENT_AMBULATORY_CARE_PROVIDER_SITE_OTHER): Payer: Self-pay

## 2024-05-18 MED ORDER — BUTALBITAL-APAP-CAFFEINE 50-325-40 MG PO TABS
1.0000 | ORAL_TABLET | ORAL | 0 refills | Status: DC | PRN
  Filled 2024-05-18: qty 15, 3d supply, fill #0

## 2024-05-18 NOTE — Progress Notes (Signed)
 Specialty Pharmacy Refill Coordination Note  MyChart Questionnaire Submission  Ashley Hancock is a 43 y.o. female contacted today regarding refills of specialty medication(s) Dupixent .  Doses on hand: (Patient-Rptd) 0   Injection date: (Patient-Rptd) 05/31/24  Patient requested: (Patient-Rptd) Delivery   Delivery date: 05/20/24  Verified address: [address] [place] [address]  Medication will be filled on 05/19/24.

## 2024-05-27 ENCOUNTER — Other Ambulatory Visit: Payer: Self-pay

## 2024-05-27 MED ORDER — AMPHETAMINE-DEXTROAMPHETAMINE 20 MG PO TABS
20.0000 mg | ORAL_TABLET | Freq: Every morning | ORAL | 0 refills | Status: DC
  Filled 2024-05-27 – 2024-05-28 (×2): qty 30, 30d supply, fill #0

## 2024-05-28 ENCOUNTER — Other Ambulatory Visit: Payer: Self-pay

## 2024-05-30 ENCOUNTER — Other Ambulatory Visit: Payer: Self-pay

## 2024-05-30 MED ORDER — AMPHETAMINE-DEXTROAMPHETAMINE 15 MG PO TABS
15.0000 mg | ORAL_TABLET | Freq: Every day | ORAL | 0 refills | Status: DC
  Filled 2024-05-30: qty 30, 30d supply, fill #0

## 2024-05-31 ENCOUNTER — Other Ambulatory Visit: Payer: Self-pay

## 2024-06-01 ENCOUNTER — Other Ambulatory Visit: Payer: Self-pay

## 2024-06-01 ENCOUNTER — Telehealth: Payer: Self-pay

## 2024-06-01 NOTE — Telephone Encounter (Signed)
 Pharmacy Patient Advocate Encounter  Received notification from [facility] that Prior Authorization for Dupixent  has been APPROVED from 06/01/24 to 05/31/25   PA #/Case ID/Reference #: [id number]

## 2024-06-01 NOTE — Telephone Encounter (Signed)
 Pharmacy Patient Advocate Encounter   Received notification from Onbase that prior authorization for Dupixent  is required/requested.   Insurance verification completed.   The patient is insured through [facility].   Per test claim: PA required; PA submitted to above mentioned insurance via Latent Key/confirmation #/EOC [id number] Status is pending

## 2024-06-17 ENCOUNTER — Other Ambulatory Visit (HOSPITAL_COMMUNITY): Payer: Self-pay

## 2024-06-17 ENCOUNTER — Other Ambulatory Visit: Payer: Self-pay

## 2024-06-22 ENCOUNTER — Other Ambulatory Visit: Payer: Self-pay

## 2024-06-22 NOTE — Progress Notes (Signed)
 Specialty Pharmacy Refill Coordination Note  Ashley Hancock is a 43 y.o. female contacted today regarding refills of specialty medication(s) Dupilumab  (Dupixent )   Patient requested (Patient-Rptd) Delivery   Delivery date: 06/24/24   Verified address: (Patient-Rptd) [address]   Medication will be filled on: 06/23/24

## 2024-06-23 ENCOUNTER — Other Ambulatory Visit: Payer: Self-pay

## 2024-06-27 ENCOUNTER — Other Ambulatory Visit: Payer: Self-pay

## 2024-06-28 ENCOUNTER — Other Ambulatory Visit: Payer: Self-pay

## 2024-06-28 MED ORDER — AMPHETAMINE-DEXTROAMPHETAMINE 15 MG PO TABS
15.0000 mg | ORAL_TABLET | Freq: Every day | ORAL | 0 refills | Status: DC
  Filled 2024-06-28: qty 30, 30d supply, fill #0

## 2024-06-28 MED ORDER — AMPHETAMINE-DEXTROAMPHETAMINE 20 MG PO TABS
20.0000 mg | ORAL_TABLET | Freq: Every morning | ORAL | 0 refills | Status: DC
  Filled 2024-06-28: qty 30, 30d supply, fill #0

## 2024-07-14 ENCOUNTER — Other Ambulatory Visit: Payer: Self-pay

## 2024-07-14 ENCOUNTER — Other Ambulatory Visit (HOSPITAL_COMMUNITY): Payer: Self-pay

## 2024-07-14 MED ORDER — BUTALBITAL-APAP-CAFFEINE 50-325-40 MG PO TABS
1.0000 | ORAL_TABLET | ORAL | 2 refills | Status: AC | PRN
  Filled 2024-07-14: qty 30, 5d supply, fill #0

## 2024-07-16 ENCOUNTER — Other Ambulatory Visit: Payer: Self-pay

## 2024-07-20 ENCOUNTER — Other Ambulatory Visit (HOSPITAL_COMMUNITY): Payer: Self-pay

## 2024-07-20 ENCOUNTER — Other Ambulatory Visit: Payer: Self-pay

## 2024-07-20 ENCOUNTER — Encounter (INDEPENDENT_AMBULATORY_CARE_PROVIDER_SITE_OTHER): Payer: Self-pay

## 2024-07-20 NOTE — Telephone Encounter (Signed)
 DO NOT INITIATE. QUESTIONNAIRE ANSWERED. PATIENT WILL NEED TO CALL DUPIXENT  AND STATE THAT THEY ARE NOT ON A MAXIMIZER PLAN

## 2024-07-20 NOTE — Telephone Encounter (Signed)
 DO NOT INITIATE. QUESTIONNAIRE ANSWERED. PATIENT WILL NEED TO CALL DUPIXENT  AND STATE THAT THEY ARE NOT ON A MAXIMIZER PLAN. LEFT PATIENT A VOICEMAIL ON 01/13 ([place])

## 2024-07-26 ENCOUNTER — Other Ambulatory Visit: Payer: Self-pay

## 2024-07-26 MED ORDER — AMPHETAMINE-DEXTROAMPHETAMINE 20 MG PO TABS
20.0000 mg | ORAL_TABLET | Freq: Every morning | ORAL | 0 refills | Status: AC
  Filled 2024-07-27: qty 30, 30d supply, fill #0

## 2024-07-26 MED ORDER — AMPHETAMINE-DEXTROAMPHETAMINE 15 MG PO TABS
15.0000 mg | ORAL_TABLET | Freq: Every day | ORAL | 0 refills | Status: AC
  Filled 2024-07-27: qty 30, 30d supply, fill #0
  Filled 2024-07-28: qty 10, 10d supply, fill #0
  Filled 2024-07-28: qty 20, 20d supply, fill #0

## 2024-07-27 ENCOUNTER — Other Ambulatory Visit: Payer: Self-pay

## 2024-07-28 ENCOUNTER — Other Ambulatory Visit: Payer: Self-pay

## 2024-07-30 ENCOUNTER — Other Ambulatory Visit: Payer: Self-pay

## 2024-08-02 ENCOUNTER — Other Ambulatory Visit: Payer: Self-pay

## 2024-08-03 ENCOUNTER — Other Ambulatory Visit: Payer: Self-pay

## 2024-08-04 ENCOUNTER — Other Ambulatory Visit: Payer: Self-pay

## 2024-08-04 MED ORDER — NURTEC 75 MG PO TBDP
75.0000 mg | ORAL_TABLET | ORAL | 5 refills | Status: AC
  Filled 2024-08-04: qty 16, 30d supply, fill #0

## 2024-08-05 ENCOUNTER — Other Ambulatory Visit: Payer: Self-pay

## 2024-08-08 ENCOUNTER — Other Ambulatory Visit: Payer: Self-pay

## 2024-08-10 ENCOUNTER — Other Ambulatory Visit: Payer: Self-pay

## 2024-08-11 ENCOUNTER — Other Ambulatory Visit: Payer: Self-pay | Admitting: Pharmacist

## 2024-08-11 ENCOUNTER — Other Ambulatory Visit: Payer: Self-pay

## 2024-08-11 MED ORDER — TRETINOIN 0.05 % EX CREA
TOPICAL_CREAM | CUTANEOUS | 5 refills | Status: AC
  Filled 2024-08-11: qty 45, 30d supply, fill #0

## 2024-08-11 NOTE — Progress Notes (Signed)
 Specialty Pharmacy Refill Coordination Note  Ashley Hancock is a 44 y.o. female contacted today regarding refills of specialty medication(s) Dupilumab  (Dupixent )   Patient requested Delivery   Delivery date: 08/12/24   Verified address: [address] [address]   Medication will be filled on: 08/11/24  Patient agreed to pay $50 copay and provided updated cc information.

## 2024-08-11 NOTE — Progress Notes (Signed)
 Specialty Pharmacy Ongoing Clinical Assessment Note  Ashley Hancock is a 45 y.o. female who is being followed by the specialty pharmacy service for RxSp Atopic Dermatitis   Patient's specialty medication(s) reviewed today: Dupilumab  (Dupixent )   Missed doses in the last 4 weeks: 1   Patient/Caregiver did not have any additional questions or concerns.   Therapeutic benefit summary: Patient is achieving benefit   Adverse events/side effects summary: No adverse events/side effects   Patient's therapy is appropriate to: Continue    Goals Addressed             This Visit's Progress    Minimize recurrence of flares   On track    Patient is on track. Patient will maintain adherence         Follow up: 12 months  Lyle LELON Chalk Specialty Pharmacist

## 2024-09-01 ENCOUNTER — Ambulatory Visit
